# Patient Record
Sex: Female | Born: 1940 | Race: Black or African American | Hispanic: No | Smoking: Former smoker
Health system: Southern US, Community
[De-identification: ages and names within clinical notes are randomized; demographics above are authoritative.]

## PROBLEM LIST (undated history)

## (undated) DIAGNOSIS — K219 Gastro-esophageal reflux disease without esophagitis: Secondary | ICD-10-CM

## (undated) DIAGNOSIS — E119 Type 2 diabetes mellitus without complications: Secondary | ICD-10-CM

## (undated) DIAGNOSIS — F419 Anxiety disorder, unspecified: Secondary | ICD-10-CM

## (undated) DIAGNOSIS — K635 Polyp of colon: Secondary | ICD-10-CM

## (undated) DIAGNOSIS — D510 Vitamin B12 deficiency anemia due to intrinsic factor deficiency: Secondary | ICD-10-CM

## (undated) DIAGNOSIS — E785 Hyperlipidemia, unspecified: Secondary | ICD-10-CM

## (undated) DIAGNOSIS — E538 Deficiency of other specified B group vitamins: Secondary | ICD-10-CM

## (undated) DIAGNOSIS — I10 Essential (primary) hypertension: Secondary | ICD-10-CM

## (undated) DIAGNOSIS — E042 Nontoxic multinodular goiter: Secondary | ICD-10-CM

## (undated) DIAGNOSIS — M1990 Unspecified osteoarthritis, unspecified site: Secondary | ICD-10-CM

## (undated) DIAGNOSIS — J45909 Unspecified asthma, uncomplicated: Secondary | ICD-10-CM

## (undated) HISTORY — DX: Unspecified asthma, uncomplicated: J45.909

## (undated) HISTORY — DX: Vitamin B12 deficiency anemia due to intrinsic factor deficiency: D51.0

## (undated) HISTORY — DX: Deficiency of other specified B group vitamins: E53.8

## (undated) HISTORY — PX: UTERINE FIBROID SURGERY: SHX826

## (undated) HISTORY — DX: Type 2 diabetes mellitus without complications: E11.9

## (undated) HISTORY — DX: Nontoxic multinodular goiter: E04.2

## (undated) HISTORY — DX: Polyp of colon: K63.5

## (undated) HISTORY — DX: Hyperlipidemia, unspecified: E78.5

## (undated) HISTORY — DX: Anxiety disorder, unspecified: F41.9

## (undated) HISTORY — DX: Essential (primary) hypertension: I10

---

## 1998-01-05 ENCOUNTER — Other Ambulatory Visit: Admission: RE | Admit: 1998-01-05 | Discharge: 1998-01-05 | Payer: Self-pay | Admitting: Obstetrics and Gynecology

## 1999-09-20 ENCOUNTER — Other Ambulatory Visit: Admission: RE | Admit: 1999-09-20 | Discharge: 1999-09-20 | Payer: Self-pay | Admitting: Obstetrics and Gynecology

## 2001-02-12 ENCOUNTER — Other Ambulatory Visit: Admission: RE | Admit: 2001-02-12 | Discharge: 2001-02-12 | Payer: Self-pay | Admitting: Obstetrics and Gynecology

## 2002-09-09 ENCOUNTER — Other Ambulatory Visit: Admission: RE | Admit: 2002-09-09 | Discharge: 2002-09-09 | Payer: Self-pay | Admitting: Obstetrics and Gynecology

## 2003-09-15 ENCOUNTER — Other Ambulatory Visit: Admission: RE | Admit: 2003-09-15 | Discharge: 2003-09-15 | Payer: Self-pay | Admitting: Obstetrics and Gynecology

## 2004-01-26 ENCOUNTER — Ambulatory Visit (HOSPITAL_COMMUNITY): Admission: RE | Admit: 2004-01-26 | Discharge: 2004-01-27 | Payer: Self-pay | Admitting: Ophthalmology

## 2005-05-02 ENCOUNTER — Other Ambulatory Visit: Admission: RE | Admit: 2005-05-02 | Discharge: 2005-05-02 | Payer: Self-pay | Admitting: Obstetrics and Gynecology

## 2005-05-23 ENCOUNTER — Ambulatory Visit: Payer: Self-pay | Admitting: Cardiology

## 2007-01-22 DIAGNOSIS — K635 Polyp of colon: Secondary | ICD-10-CM

## 2007-01-22 HISTORY — DX: Polyp of colon: K63.5

## 2007-08-17 ENCOUNTER — Encounter: Payer: Self-pay | Admitting: Endocrinology

## 2007-08-17 ENCOUNTER — Ambulatory Visit (HOSPITAL_COMMUNITY): Admission: RE | Admit: 2007-08-17 | Discharge: 2007-08-17 | Payer: Self-pay | Admitting: Internal Medicine

## 2007-12-10 ENCOUNTER — Encounter: Payer: Self-pay | Admitting: Endocrinology

## 2007-12-16 ENCOUNTER — Encounter: Payer: Self-pay | Admitting: Endocrinology

## 2007-12-16 LAB — CONVERTED CEMR LAB
Alkaline Phosphatase: 66 units/L
Calcium: 9.2 mg/dL
Cholesterol: 179 mg/dL
Creatinine, Ser: 0.9 mg/dL
Ferritin: 49 ng/mL
Glucose, Bld: 97 mg/dL
HDL: 71 mg/dL
LDL (calc): 91 mg/dL
Lymphocytes, automated: 1.7 %
MCV: 83.8 fL
RDW: 15 %
Sodium: 143 meq/L
TIBC: 318 ug/dL
Total Bilirubin: 0.3 mg/dL
Total Protein: 6.4 g/dL

## 2008-01-11 ENCOUNTER — Emergency Department (HOSPITAL_COMMUNITY): Admission: EM | Admit: 2008-01-11 | Discharge: 2008-01-11 | Payer: Self-pay | Admitting: Emergency Medicine

## 2008-04-13 ENCOUNTER — Ambulatory Visit (HOSPITAL_COMMUNITY): Admission: RE | Admit: 2008-04-13 | Discharge: 2008-04-13 | Payer: Self-pay | Admitting: Internal Medicine

## 2008-06-21 ENCOUNTER — Encounter: Payer: Self-pay | Admitting: Endocrinology

## 2008-06-24 ENCOUNTER — Encounter: Payer: Self-pay | Admitting: Gastroenterology

## 2008-06-24 ENCOUNTER — Encounter: Payer: Self-pay | Admitting: Endocrinology

## 2008-06-24 LAB — CONVERTED CEMR LAB
Eosinophils Relative: 0.2 %
Hemoglobin: 11.7 g/dL
Hgb A1c MFr Bld: 6.1 %
Iron: 76 ug/dL
MCV: 86.3 fL
Monocytes Relative: 0.6 %
Platelets: 224 10*3/uL
RBC: 4.02 M/uL
TIBC: 361 ug/dL
WBC: 4.9 10*3/uL

## 2008-06-28 ENCOUNTER — Encounter: Payer: Self-pay | Admitting: Endocrinology

## 2008-06-28 LAB — CONVERTED CEMR LAB: Ferritin: 40 ng/mL

## 2008-07-04 ENCOUNTER — Ambulatory Visit (HOSPITAL_COMMUNITY): Payer: Self-pay | Admitting: Oncology

## 2008-07-04 ENCOUNTER — Encounter (HOSPITAL_COMMUNITY): Admission: RE | Admit: 2008-07-04 | Discharge: 2008-08-03 | Payer: Self-pay | Admitting: Oncology

## 2008-07-27 ENCOUNTER — Ambulatory Visit: Payer: Self-pay | Admitting: Internal Medicine

## 2008-07-27 DIAGNOSIS — R198 Other specified symptoms and signs involving the digestive system and abdomen: Secondary | ICD-10-CM

## 2008-07-28 ENCOUNTER — Encounter: Payer: Self-pay | Admitting: Internal Medicine

## 2008-08-03 ENCOUNTER — Telehealth: Payer: Self-pay | Admitting: Gastroenterology

## 2008-08-04 ENCOUNTER — Ambulatory Visit: Payer: Self-pay | Admitting: Internal Medicine

## 2008-11-04 ENCOUNTER — Ambulatory Visit (HOSPITAL_COMMUNITY): Payer: Self-pay | Admitting: Oncology

## 2008-11-04 ENCOUNTER — Encounter (HOSPITAL_COMMUNITY): Admission: RE | Admit: 2008-11-04 | Discharge: 2008-12-04 | Payer: Self-pay | Admitting: Oncology

## 2009-03-07 ENCOUNTER — Encounter: Payer: Self-pay | Admitting: Endocrinology

## 2009-03-07 ENCOUNTER — Encounter: Payer: Self-pay | Admitting: Gastroenterology

## 2009-03-07 ENCOUNTER — Ambulatory Visit (HOSPITAL_COMMUNITY): Admission: RE | Admit: 2009-03-07 | Discharge: 2009-03-07 | Payer: Self-pay | Admitting: Internal Medicine

## 2009-04-27 ENCOUNTER — Encounter: Payer: Self-pay | Admitting: Internal Medicine

## 2009-05-02 ENCOUNTER — Telehealth (INDEPENDENT_AMBULATORY_CARE_PROVIDER_SITE_OTHER): Payer: Self-pay | Admitting: *Deleted

## 2009-05-05 ENCOUNTER — Encounter (INDEPENDENT_AMBULATORY_CARE_PROVIDER_SITE_OTHER): Payer: Self-pay | Admitting: *Deleted

## 2009-06-06 ENCOUNTER — Ambulatory Visit: Payer: Self-pay | Admitting: Gastroenterology

## 2009-06-06 DIAGNOSIS — R159 Full incontinence of feces: Secondary | ICD-10-CM

## 2009-06-27 ENCOUNTER — Encounter: Payer: Self-pay | Admitting: Endocrinology

## 2009-07-04 ENCOUNTER — Encounter (HOSPITAL_COMMUNITY): Admission: RE | Admit: 2009-07-04 | Discharge: 2009-08-03 | Payer: Self-pay | Admitting: Oncology

## 2009-07-04 ENCOUNTER — Ambulatory Visit (HOSPITAL_COMMUNITY): Payer: Self-pay | Admitting: Oncology

## 2009-11-02 ENCOUNTER — Encounter: Payer: Self-pay | Admitting: Endocrinology

## 2009-11-09 ENCOUNTER — Ambulatory Visit: Payer: Self-pay | Admitting: Endocrinology

## 2009-11-09 DIAGNOSIS — J45909 Unspecified asthma, uncomplicated: Secondary | ICD-10-CM

## 2009-11-09 DIAGNOSIS — E119 Type 2 diabetes mellitus without complications: Secondary | ICD-10-CM

## 2009-11-09 DIAGNOSIS — E538 Deficiency of other specified B group vitamins: Secondary | ICD-10-CM

## 2009-11-09 HISTORY — DX: Type 2 diabetes mellitus without complications: E11.9

## 2009-11-09 HISTORY — DX: Deficiency of other specified B group vitamins: E53.8

## 2009-11-09 HISTORY — DX: Unspecified asthma, uncomplicated: J45.909

## 2009-11-09 LAB — CONVERTED CEMR LAB
Fructosamine: 270 micromoles/L (ref <285)
Hgb A1c MFr Bld: 6.5 % (ref 4.6–6.5)

## 2009-11-15 ENCOUNTER — Encounter: Admission: RE | Admit: 2009-11-15 | Discharge: 2009-11-15 | Payer: Self-pay | Admitting: Endocrinology

## 2009-11-20 ENCOUNTER — Telehealth (INDEPENDENT_AMBULATORY_CARE_PROVIDER_SITE_OTHER): Payer: Self-pay | Admitting: *Deleted

## 2009-11-27 ENCOUNTER — Telehealth: Payer: Self-pay | Admitting: Endocrinology

## 2009-12-06 ENCOUNTER — Encounter: Payer: Self-pay | Admitting: Endocrinology

## 2009-12-07 ENCOUNTER — Encounter: Payer: Self-pay | Admitting: Endocrinology

## 2009-12-07 DIAGNOSIS — E042 Nontoxic multinodular goiter: Secondary | ICD-10-CM

## 2009-12-07 HISTORY — DX: Nontoxic multinodular goiter: E04.2

## 2009-12-13 ENCOUNTER — Encounter: Admission: RE | Admit: 2009-12-13 | Discharge: 2009-12-13 | Payer: Self-pay | Admitting: Endocrinology

## 2009-12-13 ENCOUNTER — Encounter: Payer: Self-pay | Admitting: Endocrinology

## 2009-12-13 ENCOUNTER — Other Ambulatory Visit
Admission: RE | Admit: 2009-12-13 | Discharge: 2009-12-13 | Payer: Self-pay | Source: Home / Self Care | Admitting: Interventional Radiology

## 2010-02-09 ENCOUNTER — Ambulatory Visit
Admission: RE | Admit: 2010-02-09 | Discharge: 2010-02-09 | Payer: Self-pay | Source: Home / Self Care | Attending: Endocrinology | Admitting: Endocrinology

## 2010-02-09 ENCOUNTER — Other Ambulatory Visit: Payer: Self-pay | Admitting: Endocrinology

## 2010-02-09 LAB — HEMOGLOBIN A1C: Hgb A1c MFr Bld: 6.3 % (ref 4.6–6.5)

## 2010-02-10 ENCOUNTER — Encounter: Payer: Self-pay | Admitting: Endocrinology

## 2010-02-18 LAB — CONVERTED CEMR LAB
CO2: 24 meq/L
Calcium: 9.4 mg/dL
Creatinine, Ser: 0.92 mg/dL
Eosinophils Relative: 0.3 %
HCT: 35.2 %
Hemoglobin: 11.1 g/dL
Hgb A1c MFr Bld: 6.2 %
Lymphocytes, automated: 1.8 %
MCV: 85.6 fL
Platelets: 232 10*3/uL
RBC: 4.11 M/uL
RDW: 16.4 %
Sodium: 141 meq/L

## 2010-02-22 NOTE — Letter (Signed)
Summary: [facility]   Imported By: Sherian Rein 11/24/2009 10:58:52  _____________________________________________________________________  External Attachment:    Type:   Image     Comment:   External Document

## 2010-02-22 NOTE — Assessment & Plan Note (Signed)
Summary: PER PT 3 MTH FU  D/T  STC   Vital Signs:  Patient profile:   70 year old female Height:      65 inches (165.10 cm) Weight:      193.38 pounds (87.90 kg) BMI:     32.30 O2 Sat:      97 % on Room air Temp:     98.3 degrees F (36.83 degrees C) oral Pulse rate:   70 / minute Pulse rhythm:   regular BP sitting:   112 / 70  (left arm) Cuff size:   regular  Vitals Entered By: Brenton Grills CMA Duncan Dull) (February 09, 2010 8:11 AM)  O2 Flow:  Room air CC: 3 month F/U/aj Is Patient Diabetic? Yes   Referring Provider:  Elfredia Nevins MD Primary Provider:  Kathrin Ruddy  CC:  3 month F/U/aj.  History of Present Illness: pt states she feels well in general.  i have reread the report from opthal in june, 2011.  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    Current Medications (verified): 1)  Hydroxyzine Hcl 25 Mg Tabs (Hydroxyzine Hcl) .... Take 1 Tablet By Mouth Once A Day 2)  Simvastatin 40 Mg Tabs (Simvastatin) .... Take 1 Tablet By Mouth Once A Day 3)  Aleve 220 Mg Tabs (Naproxen Sodium) .... As Needed 4)  Melatonin 5 Mg Caps (Melatonin) .... Take 1 Tab By Mouth At Bedtime 5)  Losartan Potassium 25 Mg Tabs (Losartan Potassium) .... Take 1 Tablet By Mouth Once A Day 6)  Anusol-Hc 25 Mg Supp (Hydrocortisone Acetate) .... Take One Suppository Q.h.s. 7)  Tsh .... 241.1  Allergies (verified): 1)  ! Penicillin 2)  ! Sulfa  Past History:  Past Medical History: Last updated: 06/06/2009 Anxiety Disorder Diabetes Hyperlipidemia Hypertension Pernicious anemia, on monthly B12 injections by PCP Chronic recurrent sinusitis Colon Polyps-2009  Review of Systems  The patient denies hypoglycemia.    Physical Exam  General:  normal appearance.   Pulses:  dorsalis pedis intact bilat.    Extremities:  no deformity.  no ulcer on the feet.  feet are of normal color and temp.  no edema  Neurologic:  sensation is intact to touch on the  feet  Additional Exam:  Hemoglobin A1C            6.3 %     Impression & Recommendations:  Problem # 1:  DM (ICD-250.00) well-controlled as i reread the opthal report, she has htn retinopathy, but not dm retinopathy  Other Orders: TLB-A1C / Hgb A1C (Glycohemoglobin) (83036-A1C) Est. Patient Level III ([address])  Patient Instructions: 1)  blood tests are being ordered for you today.  please call [phone] to hear your test results. 2)  pending the test results, please continue the same medications for now 3)  check your blood sugar 1 time a day.  vary the time of day when you check, between before the 3 meals, and at bedtime.  also check if you have symptoms of your blood sugar being too high or too low.  please keep a record of the readings and bring it to your next appointment here.  please call us sooner if you are having low blood sugar episodes. 4)  Please schedule a follow-up appointment in 6 months. 5)  (update: i left message on phone-tree:  rx as we discussed).   Orders Added: 1)  TLB-A1C / Hgb A1C (Glycohemoglobin) [83036-A1C] 2)  Est. Patient Level III [81191]

## 2010-02-22 NOTE — Letter (Signed)
Summary: New Patient letter  [facility]  [address] [place], [place] [address]   Phone: [phone]  Fax: [phone]       05/05/2009 MRN: [id number]  [facility] [address] [place], [place]  [address]  Dear Ms. Livingston,  Welcome to the Gastroenterology Division at [facility].    You are scheduled to see Dr.  Arlyce Dice on 06-06-09 at 1:30p.m. on the 3rd floor at [facility], [address]  We ask that you try to arrive at our office 15 minutes prior to your appointment time to allow for check-in.  We would like you to complete the enclosed self-administered evaluation form prior to your visit and bring it with you on the day of your appointment.  We will review it with you.  Also, please bring a complete list of all your medications or, if you prefer, bring the medication bottles and we will list them.  Please bring your insurance card so that we may make a copy of it.  If your insurance requires a referral to see a specialist, please bring your referral form from your primary care physician.  Co-payments are due at the time of your visit and may be paid by cash, check or credit card.     Your office visit will consist of a consult with your physician (includes a physical exam), any laboratory testing he/she may order, scheduling of any necessary diagnostic testing (e.g. x-ray, ultrasound, CT-scan), and scheduling of a procedure (e.g. Endoscopy, Colonoscopy) if required.  Please allow enough time on your schedule to allow for any/all of these possibilities.    If you cannot keep your appointment, please call [phone] to cancel or reschedule prior to your appointment date.  This allows [place] the opportunity to schedule an appointment for another patient in need of care.  If you do not cancel or reschedule by 5 p.m. the business day prior to your appointment date, you will be charged a $50.00 late cancellation/no-show fee.    Thank you for choosing  [facility] for your medical needs.  We appreciate the opportunity to care for you.  Please visit [place] at our website  to learn more about our practice.                     Sincerely,                                                             The Gastroenterology Division

## 2010-02-22 NOTE — Progress Notes (Signed)
  Phone Note Other Incoming   Request: Send information Summary of Call: Records received from Dr. Ashley Royalty. 41 pages forwarded to Dr. Everardo All for review.

## 2010-02-22 NOTE — Progress Notes (Signed)
Summary: records for Dr. Arlyce Dice to review  ---- Converted from flag ---- ---- 05/01/2009 4:40 PM, Tawni Levy wrote: Patient wants to know if Dr Arlyce Dice reviewed her records and if so can she get an appt with him ------------------------------  Phone Note Outgoing Call Call back at [phone] home number   Call placed by: Harlow Mares CMA Duncan Dull),  May 02, 2009 11:49 AM Call placed to: Patient Summary of Call: spoke to patient and all of her records from Dr. Jena Gauss are in EMR she never saw him she only seen the PA. Paitent wants to change to Dr. Arlyce Dice because she did not feel like the PA helped her and she never seen the MD. Dr. Arlyce Dice will you accept this patient. Initial call taken by: Harlow Mares CMA Duncan Dull),  May 02, 2009 11:51 AM  Follow-up for Phone Call        ok Follow-up by: Louis Meckel MD,  May 03, 2009 9:08 AM  Additional Follow-up for Phone Call Additional follow up Details #1::        i willl route this to [facility] to schedule the patient. Additional Follow-up by: Harlow Mares CMA Duncan Dull),  May 04, 2009 8:37 AM    Additional Follow-up for Phone Call Additional follow up Details #2::    Pt. is sch'd for 06-06-09 w/Dr. Arlyce Dice. Pt. aware of meds, copay and cx policy Follow-up by: Karna Christmas,  May 05, 2009 9:55 AM

## 2010-02-22 NOTE — Letter (Signed)
Summary: [name]  [name]   Imported By: Lester [place] 06/12/2009 10:29:43  _____________________________________________________________________  External Attachment:    Type:   Image     Comment:   External Document

## 2010-02-22 NOTE — Miscellaneous (Signed)
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

## 2010-02-22 NOTE — Letter (Signed)
Summary: Results Letter  [facility]  [address] [place], [place] [address]   Phone: [phone]  Fax: [phone]        Jun 06, 2009 MRN: [id number]    [facility] [address] [place], [place]  [address]    Dear Ms. Mcghee,  It is my pleasure to have treated you recently as a new patient in my office. I appreciate your confidence and the opportunity to participate in your care.  Since I do have a busy inpatient endoscopy schedule and office schedule, my office hours vary weekly. I am, however, available for emergency calls everyday through my office. If I am not available for an urgent office appointment, another one of our gastroenterologist will be able to assist you.  My well-trained staff are prepared to help you at all times. For emergencies after office hours, a physician from our Gastroenterology section is always available through my 24 hour answering service  Once again I welcome you as a new patient and I look forward to a happy and healthy relationship             Sincerely,  Louis Meckel MD  This letter has been electronically signed by your physician.  Appended Document: Results Letter letter mailed

## 2010-02-22 NOTE — Assessment & Plan Note (Signed)
Summary: New Endo diabetes/Bcbs/#/cd   Vital Signs:  Patient profile:   70 year old female Height:      65 inches (165.10 cm) Weight:      196.44 pounds (89.29 kg) BMI:     32.81 O2 Sat:      96 % on Room air Temp:     97.9 degrees F (36.61 degrees C) oral Pulse rate:   69 / minute Pulse rhythm:   regular BP sitting:   112 / 64  (left arm) Cuff size:   regular  Vitals Entered By: Brenton Grills MA (November 09, 2009 8:20 AM)  O2 Flow:  Room air CC: New Endo Consult/DM II and thyroid/aj Is Patient Diabetic? Yes   Referring Provider:  Elfredia Nevins MD Primary Provider:  Kathrin Ruddy  CC:  New Endo Consult/DM II and thyroid/aj.  History of Present Illness: pt was dx'ed with dm in approx 2000.  she took metformin from then until 2010.  she stopped it due to 6 weeks of moderate diarrhea, but no assoc brbpr.  she has been off dm meds since then.  pt says chiropractor told her of a thyroid problem incidentally noted on x ray.  she is now on a "body cleanse" medication given by chiropractor, for diabetes and thyroid problem.   no cbg record, but states cbg's are low 100's. she says her diet and exercise are "good." she has had laser surgery for "bleeding behind the eyes."  Current Medications (verified): 1)  Hydroxyzine Hcl 25 Mg Tabs (Hydroxyzine Hcl) .... Take 1 Tablet By Mouth Once A Day 2)  Simvastatin 40 Mg Tabs (Simvastatin) .... Take 1 Tablet By Mouth Once A Day 3)  Aleve 220 Mg Tabs (Naproxen Sodium) .... As Needed 4)  Melatonin 5 Mg Caps (Melatonin) .... Take 1 Tab By Mouth At Bedtime 5)  Losartan Potassium 25 Mg Tabs (Losartan Potassium) .... Take 1 Tablet By Mouth Once A Day 6)  Anusol-Hc 25 Mg Supp (Hydrocortisone Acetate) .... Take One Suppository Q.h.s.  Allergies (verified): 1)  ! Penicillin 2)  ! Sulfa  Past History:  Past Medical History: Last updated: 06/06/2009 Anxiety Disorder Diabetes Hyperlipidemia Hypertension Pernicious anemia, on monthly B12  injections by PCP Chronic recurrent sinusitis Colon Polyps-2009  Family History: Reviewed history from 06/06/2009 and no changes required. 1/2 Brother deceased - colon cancer, dx age 79 Cousin-deceased- colon cancer, dx age 67s Nephew-colon cancer 1/2 Sister - Breast cancer Family History of Diabetes: Father, PGM, MGM, Mother Family History of Heart Disease: Mother, Father mother had resection of a benign goiter  Social History: Reviewed history from 06/06/2009 and no changes required. 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 Alcohol Use-1 drink/day Daily Caffeine Use 1-2 drinks/day Illicit Drug Use - no  Review of Systems       denies weight loss, headache, chest pain, sob, n/v, excessive diaphoresis, menopausal sxs, memory loss, hypoglycemia, rhinorrhea, and easy bruising.   she has chronic blurry vision, muscle cramps, anxiety, and polyuria.  Physical Exam  General:  normal appearance.   Head:  head: no deformity eyes: no periorbital swelling, no proptosis external nose and ears are normal mouth: no lesion seen Neck:  there is a 2-cm right thyroid nodule Lungs:  Clear to auscultation bilaterally. Normal respiratory effort.  Heart:  Regular rate and rhythm without murmurs or gallops noted. Normal S1,S2.   Abdomen:  abdomen is soft, nontender.  no hepatosplenomegaly.   not distended.  no hernia  Msk:  muscle bulk and strength are grossly normal.  no obvious joint swelling.  gait is normal and steady  Pulses:  dorsalis pedis intact bilat.  no carotid bruit  Extremities:  no deformity.  no ulcer on the feet.  feet are of normal color and temp.  no edema  Neurologic:  cn 2-12 grossly intact.   readily moves all 4's.   sensation is intact to touch on the feet  Skin:  normal texture and temp.  no rash.  not diaphoretic  Cervical Nodes:  No significant  adenopathy.  Psych:  Alert and cooperative; normal mood and affect; normal attention span and concentration.   Additional Exam:  Fructosamine              270 umol/L   (converts to a1c of 6.3)   Impression & Recommendations:  Problem # 1:  DM (ICD-250.00) in view of #2, she should aggressively rx a1c  Problem # 2:  retinopathy this seems out of proportion from her relatively benign a1c's  Problem # 3:  diarrhea this limits rx with metformin  Other Orders: T-Fructosamine ([address]98-11914) Radiology Referral (Radiology) TLB-A1C / Hgb A1C (Glycohemoglobin) (83036-A1C) Consultation Level IV ([address]295)  Patient Instructions: 1)  please sign release of information for the report from dr Ashley Royalty. 2)  good diet and exercise habits significanly improve the control of your diabetes.  please let me know if you wish to be referred to a dietician.  high blood sugar is very risky to your health.  you should see an eye doctor every year. 3)  controlling your blood pressure and cholesterol drastically reduces the damage diabetes does to your body.  this also applies to quitting smoking.  please discuss these with your doctor.  you should take an aspirin every day, unless you have been advised by a doctor not to. 4)  check your blood sugar 1 time a day.  vary the time of day when you check, between before the 3 meals, and at bedtime.  also check if you have symptoms of your blood sugar being too high or too low.  please keep a record of the readings and bring it to your next appointment here.  please call us sooner if you are having low blood sugar episodes. 5)  check thyroid ultrasound.  you will be called with a day and time for an appointment. 6)  blood tests and ultrasound are being ordered for you today.  please call [phone] to hear each of your test results. 7)  Please schedule a follow-up appointment in 3 months. 8)  (update: i left message on phone-tree:  please consider [place]).   Orders  Added: 1)  T-Fructosamine [[id number]] 2)  Radiology Referral [Radiology] 3)  TLB-A1C / Hgb A1C (Glycohemoglobin) [83036-A1C] 4)  Consultation Level IV [28413]

## 2010-02-22 NOTE — Letter (Signed)
Summary: [name]-[facility]  [name]   Imported By: Lester [place] 06/12/2009 10:28:34  _____________________________________________________________________  External Attachment:    Type:   Image     Comment:   External Document

## 2010-02-22 NOTE — Assessment & Plan Note (Signed)
Summary: problems w/colon--ch.   History of Present Illness Visit Type: new patient Primary GI MD: Melvia Heaps MD [facility] Primary Provider: Kathrin Ruddy Requesting Provider: n/a Chief Complaint: lower abdominal pain since March 2011 History of Present Illness:   Ms. Grace Cordova is a pleasant 70 yo AA female referred at the request of Dr. Alroy Bailiff for evaluation of diarrhea.  Iin December 2009 she was started on antibiotics for recurrent URIs.  She took antibiotics for approximately 4 months.  Thereafter she suffered from chronic diarrhea that lasted for at least 2 more months.  Stool studies apparently were negative.  Once she started Align diarrhea subsided.  She took this for several months.  After discontinuing this she had recurrent diarrhea which has subsided.  She was having sharp lower abdominal pains approximately 2 months ago but  these have subsided as well.  There is no history of melena or hematochezia.  She does complain of some stool incontinence consistenting  of stool that recurs when she wipes herself after urinating.  She does not soil her underclothes but  constantly has stool in the rectal area when she wipes.  Family history is pertinent for brother with colon cancer.  Adenomatous polyps were removed in 2009.   GI Review of Systems    Reports abdominal pain, belching, and  bloating.     Location of  Abdominal pain: lower abdomen.    Denies acid reflux, chest pain, dysphagia with liquids, dysphagia with solids, heartburn, loss of appetite, nausea, vomiting, vomiting blood, weight loss, and  weight gain.      Reports change in bowel habits, constipation, diarrhea, and  fecal incontinence.     Denies anal fissure, black tarry stools, diverticulosis, heme positive stool, hemorrhoids, irritable bowel syndrome, jaundice, light color stool, liver problems, rectal bleeding, and  rectal pain. Preventive Screening-Counseling & Management      Drug Use:  no.      Current Medications  (verified): 1)  Hydroxyzine Hcl 25 Mg Tabs (Hydroxyzine Hcl) .... Take 1 Tablet By Mouth Once A Day 2)  Simvastatin 40 Mg Tabs (Simvastatin) .... Take 1 Tablet By Mouth Once A Day 3)  Aleve 220 Mg Tabs (Naproxen Sodium) .... As Needed 4)  Melatonin 5 Mg Caps (Melatonin) .... Take 1 Tab By Mouth At Bedtime 5)  Losartan Potassium 25 Mg Tabs (Losartan Potassium) .... Take 1 Tablet By Mouth Once A Day  Allergies (verified): 1)  ! Penicillin 2)  ! Sulfa  Past History:  Past Medical History: Anxiety Disorder Diabetes Hyperlipidemia Hypertension Pernicious anemia, on monthly B12 injections by PCP Chronic recurrent sinusitis Colon Polyps-2009  Past Surgical History: Reviewed history from 07/27/2008 and no changes required. 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 Alcohol Use-1 drink/day Daily Caffeine Use 1-2 drinks/day Illicit Drug Use - no Drug Use:  no  Review of Systems       The patient complains of cough, fatigue, itching, muscle pains/cramps, shortness of breath, skin rash, sleeping problems, and vision changes.  The patient denies allergy/sinus, anemia, anxiety-new, arthritis/joint pain, back pain, blood in urine, breast changes/lumps, confusion, coughing up blood, depression-new, fainting, fever, headaches-new,  hearing problems, heart murmur, heart rhythm changes, menstrual pain, night sweats, nosebleeds, pregnancy symptoms, sore throat, swelling of feet/legs, swollen lymph glands, thirst - excessive, urination - excessive, urination changes/pain, urine leakage, and  voice change.         All other systems were reviewed and were negative   Vital Signs:  Patient profile:   70 year old female Height:      65 inches Weight:      199 pounds BMI:     33.24 Pulse rate:   68 / minute Pulse rhythm:   regular BP sitting:   108 / 60  (left arm) Cuff size:   regular  Vitals Entered By: Francee Piccolo CMA Duncan Dull) (Jun 06, 2009 1:34 PM)  Physical Exam  Additional Exam:  She is a well-developed well-nourished female  skin: anicteric HEENT: normocephalic; PEERLA; no nasal or pharyngeal abnormalities neck: supple nodes: no cervical lymphadenopathy chest: clear to ausculatation and percussion heart: no murmurs, gallops, or rubs abd: soft, nontender; BS normoactive; no abdominal masses, tenderness, organomegaly rectal: there are no rectal masses.  Stool is Hemoccult negative ext: no cynanosis, clubbing, edema skeletal: no deformities neuro: oriented x 3; no focal abnormalities    Impression & Recommendations:  Problem # 1:  CHANGE IN BOWELS (ICD-787.99) Her erratic bowels are very likely related to prior antibiotic use.  At this point symptoms have resolved while she takes align daily.  Recommendations #1 continue probiotic supplementation as needed  Problem # 2:  FULL INCONTINENCE OF FECES (ICD-787.60) Although the patient is not fully incontinent of stool she has frequent soilage.  This could be due to hemorrhoidal disease.  Recommendations #1 trial of Anusol HC suppositories #2 fiber supplementation  Problem # 3:  ADENOCARCINOMA, COLON, FAMILY HX (ICD-V16.0) Patient has colon polyps and positive family history for colon cancer.  The patient is #1 followup colonoscopy in 2014  Patient Instructions: 1)  Copy sent to : Kathrin Ruddy 2)  We are giving you fiber samples toay 3)  Please call back in a couple of weeks to let us know how you are doing 4)  The medication list was reviewed and reconciled.  All changed / newly prescribed  medications were explained.  A complete medication list was provided to the patient / caregiver. Prescriptions: ANUSOL-HC 25 MG SUPP (HYDROCORTISONE ACETATE) take one suppository q.h.s.  #7 x 1   Entered and Authorized by:   Louis Meckel MD   Signed by:   Louis Meckel MD on 06/06/2009   Method used:   Electronically to        [facility] [phone]* (retail)       [address]       [place], [place]  [address]       Ph: [phone] or [phone]       Fax: [phone]   RxID:   [id number]

## 2010-02-22 NOTE — Progress Notes (Signed)
Summary: labs  Phone Note Call from Patient Call back at Home Phone [phone]   Caller: Mom Call For: Dr Everardo All Summary of Call: Pt requests lab be done at [facility]. Dr gave her choice of here or [place]. She wants to go to [facility] for TSH. Please let her know when order is sent to [facility]. Initial call taken by: Verdell Face,  November 27, 2009 9:21 AM  Follow-up for Phone Call        i printed rx Follow-up by: Minus Breeding MD,  November 27, 2009 11:04 AM  Additional Follow-up for Phone Call Additional follow up Details #1::        Rx for lab order faxed to [facility] [phone]) lab per [facility] lab instructions. Pt informed, faxed to [phone] Additional Follow-up by: Margaret Pyle, CMA,  November 27, 2009 11:23 AM    New/Updated Medications: * TSH 241.1 Prescriptions: TSH 241.1  #0 x 0   Entered and Authorized by:   Minus Breeding MD   Signed by:   Minus Breeding MD on 11/27/2009   Method used:   Print then Give to Patient   RxID:   [id number]

## 2010-02-22 NOTE — Letter (Signed)
Summary: RELEASE OF RECORDS  RELEASE OF RECORDS   Imported By: Diana Eves 04/27/2009 09:06:28  _____________________________________________________________________  External Attachment:    Type:   Image     Comment:   External Document

## 2010-02-26 IMAGING — CT CT MAXILLOFACIAL W/O CM
3 of 4 series · 16 of 47 positions shown, 19 images · non-contrast
Comparison: None

CLINICAL DATA: Recurrent/refractory sinusitis.  Headaches.

CT MAXILLOFACIAL WITHOUT CONTRAST
TECHNIQUE: Multidetector CT imaging of the maxillofacial
structures was performed. Multiplanar CT image reconstructions were
also generated.

[Series 5: sinus 3.0 h32s · axial · 0.33mm/px · z∈[+19,+157]mm · 10 of 54 slices shown, 13 images]
[im 4/54  brain]
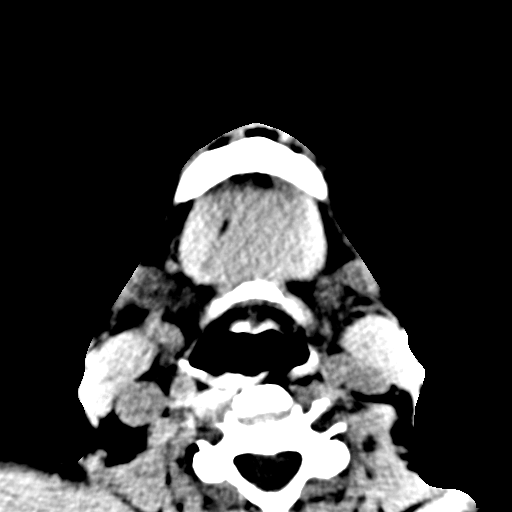
[im 4/54  bone]
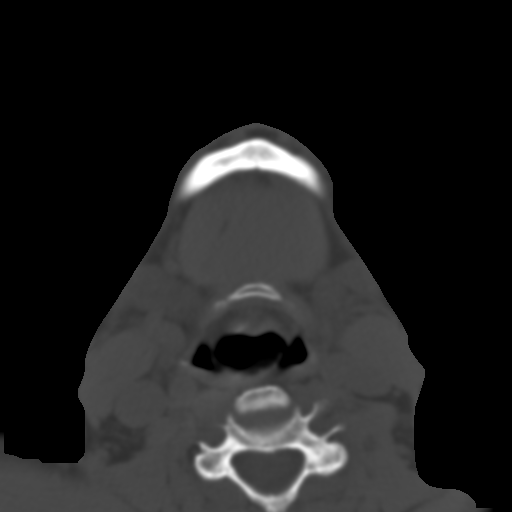
[im 10/54  bone]
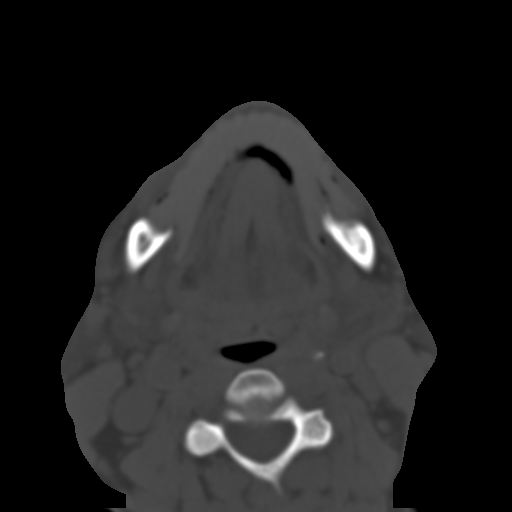
[im 16/54  bone]
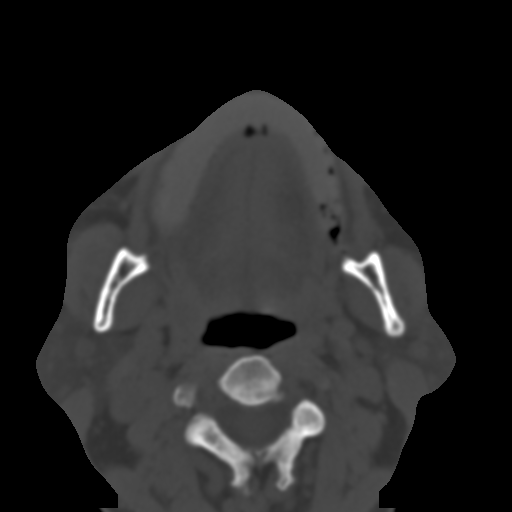
[im 19/54  bone]
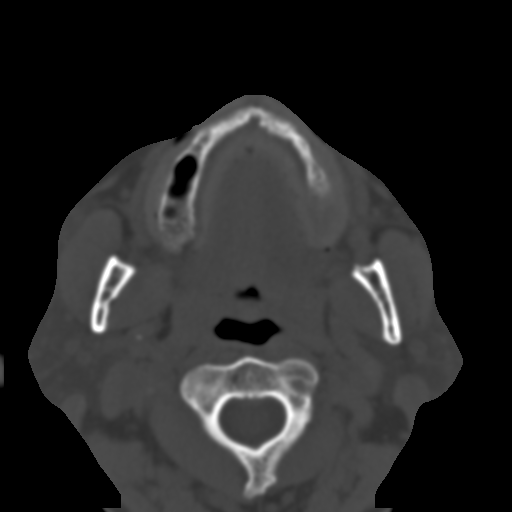
[im 25/54  brain]
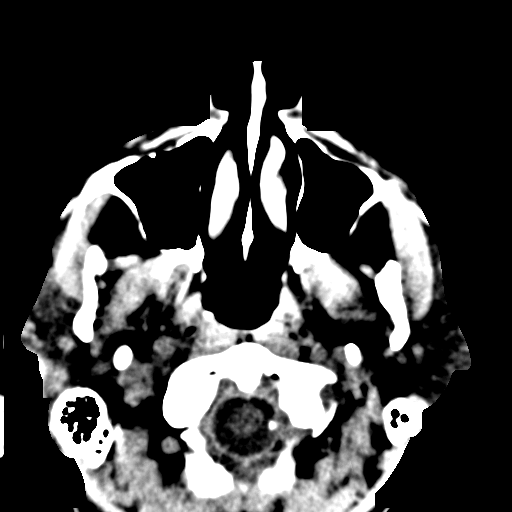
[im 25/54  bone]
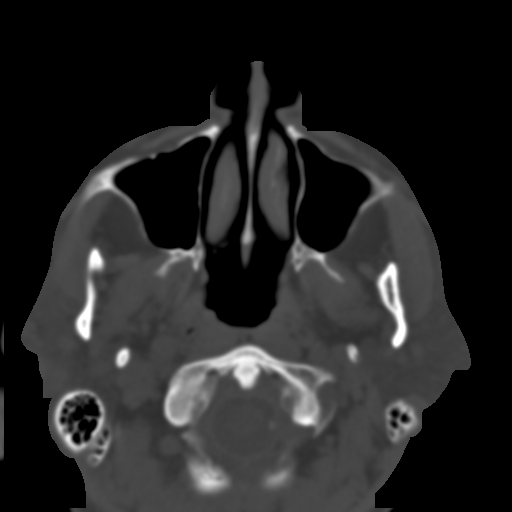
[im 29/54  bone]
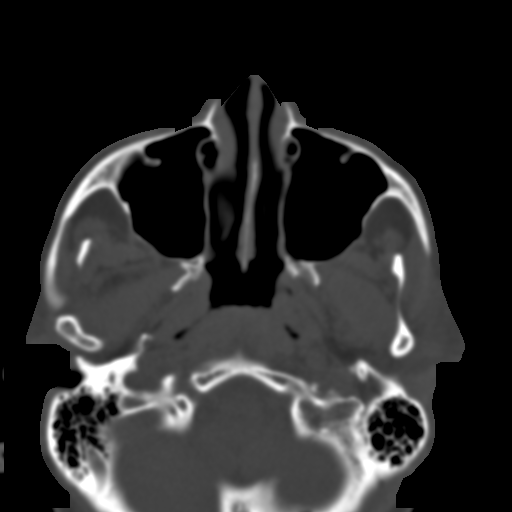
[im 35/54  bone]
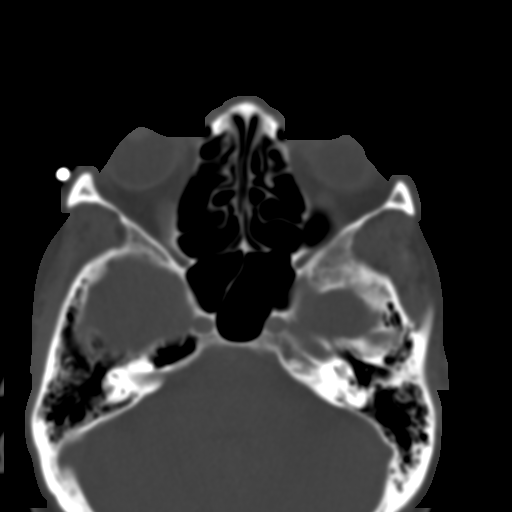
[im 41/54  bone]
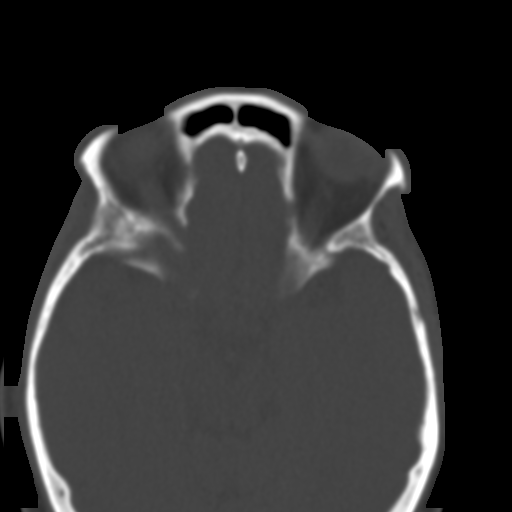
[im 44/54  brain]
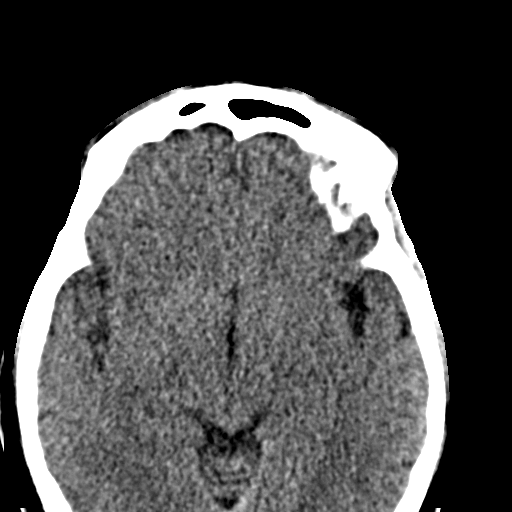
[im 44/54  bone]
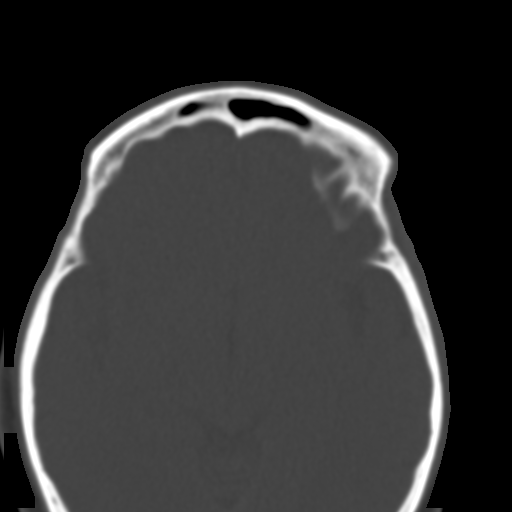
[im 50/54  bone]
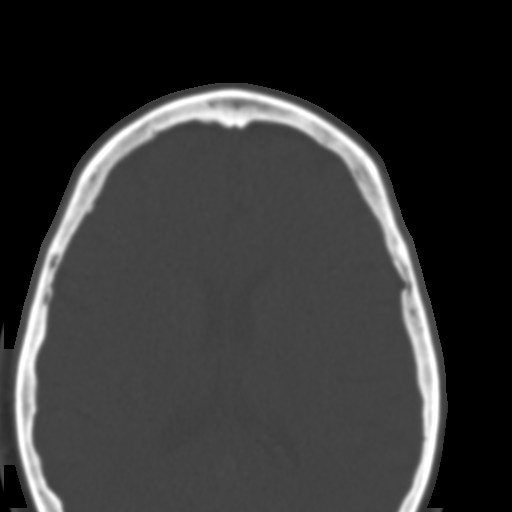

[Series 6: sinus 3.0 spo cor · coronal · 0.29mm/px · 3 of 45 slices shown]
[im 15/45  bone]
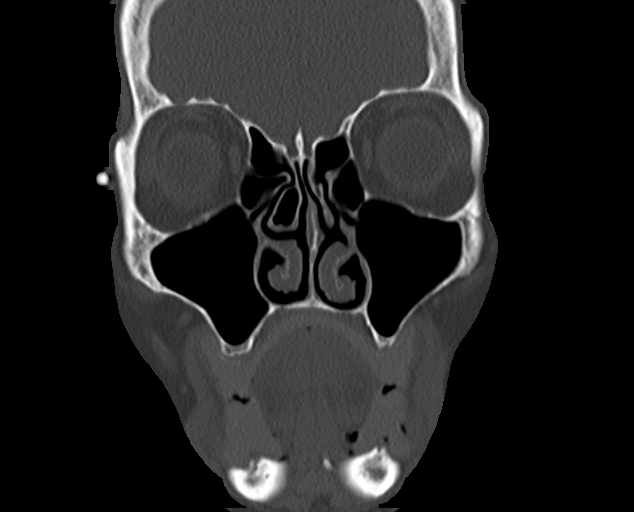
[im 20/45  bone]
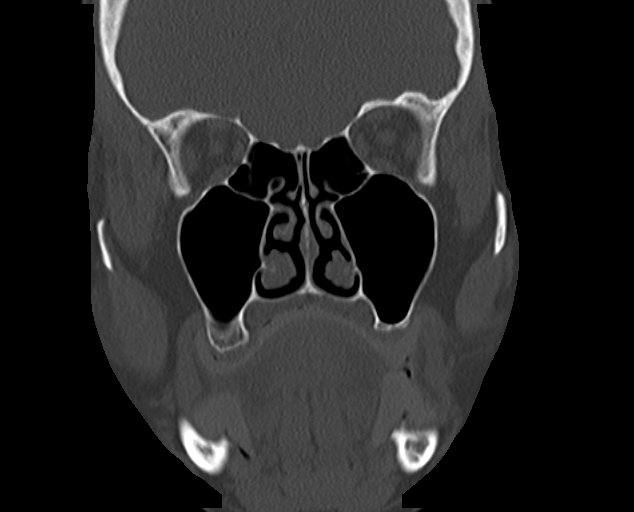
[im 25/45  bone]
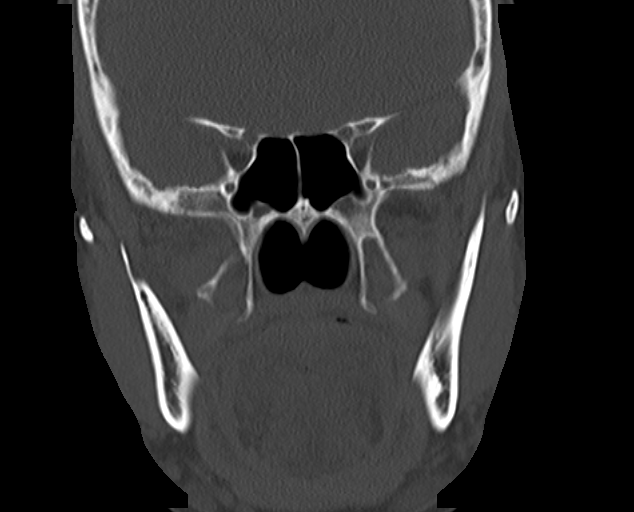

[Series 7: sinus 3.0 spo sag · sagittal · 0.32mm/px · 3 of 48 slices shown]
[im 16/48  bone]
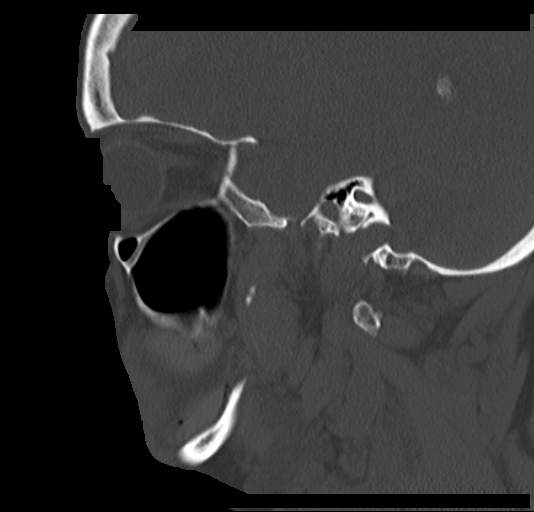
[im 24/48  bone]
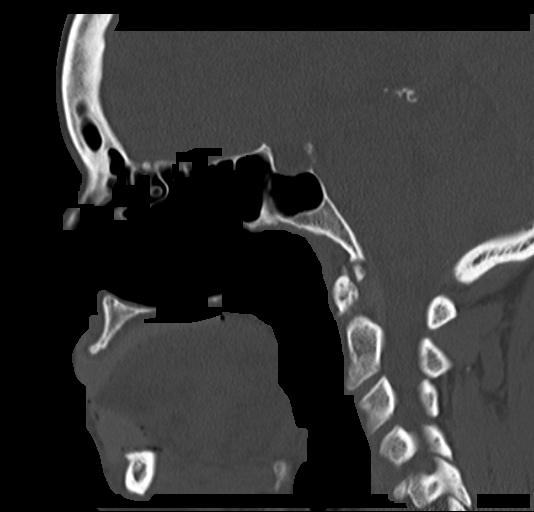
[im 32/48  bone]
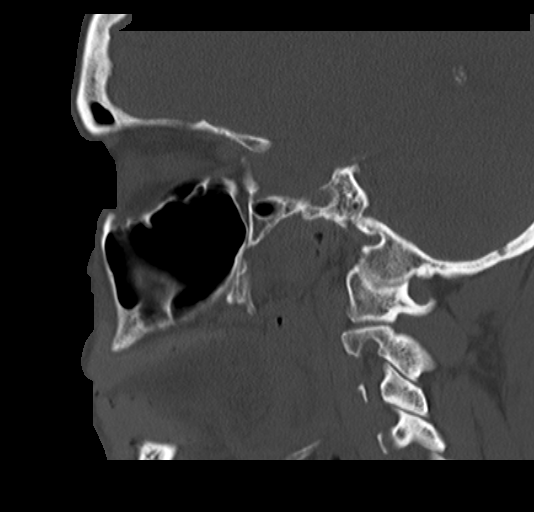

[16 of 47 positions shown; findings below may reference images not displayed]

FINDINGS: Paranasal sinuses are clear.  Infundibula appear patent.
No significant nasal septum deviation.  Mastoid air cells are
clear.  Visualized portions of the intracranial contents show no
acute findings.  Visualized soft tissues of the neck and visualized
portions of the cervical spine show no acute findings.
IMPRESSION: Clear paranasal sinuses.

## 2010-04-09 LAB — BASIC METABOLIC PANEL
BUN: 13 mg/dL (ref 6–23)
Creatinine, Ser: 0.92 mg/dL (ref 0.4–1.2)
GFR calc Af Amer: >60 mL/min (ref >60)

## 2010-04-09 LAB — CBC
HCT: 34.4 % — ABNORMAL LOW (ref 36.0–46.0)
Hemoglobin: 11.6 g/dL — ABNORMAL LOW (ref 12.0–15.0)
MCHC: 33.7 g/dL (ref 30.0–36.0)
RBC: 4.04 MIL/uL (ref 3.87–5.11)
WBC: 5.5 10*3/uL (ref 4.0–10.5)

## 2010-04-26 LAB — DIFFERENTIAL
Basophils Relative: 0 % (ref 0–1)
Lymphs Abs: 1.7 10*3/uL (ref 0.7–4.0)
Monocytes Absolute: 0.4 10*3/uL (ref 0.1–1.0)
Neutro Abs: 2.2 10*3/uL (ref 1.7–7.7)
Neutrophils Relative %: 47 % (ref 43–77)

## 2010-04-26 LAB — CBC
HCT: 32.4 % — ABNORMAL LOW (ref 36.0–46.0)
Hemoglobin: 10.9 g/dL — ABNORMAL LOW (ref 12.0–15.0)
MCHC: 33.7 g/dL (ref 30.0–36.0)
Platelets: 253 10*3/uL (ref 150–400)
RDW: 15.8 % — ABNORMAL HIGH (ref 11.5–15.5)

## 2010-04-26 LAB — RETICULOCYTES: Retic Ct Pct: 1.1 % (ref 0.4–3.1)

## 2010-04-26 LAB — HEMOGLOBIN A1C: Mean Plasma Glucose: 143 mg/dL

## 2010-04-30 LAB — RETICULOCYTES
RBC.: 4.19 MIL/uL (ref 3.87–5.11)
Retic Count, Absolute: 25.1 10*3/uL (ref 19.0–186.0)

## 2010-04-30 LAB — DIFFERENTIAL
Lymphocytes Relative: 44 % (ref 12–46)
Lymphs Abs: 2.3 10*3/uL (ref 0.7–4.0)
Monocytes Absolute: 0.6 10*3/uL (ref 0.1–1.0)
Monocytes Relative: 12 % (ref 3–12)
Neutro Abs: 2 10*3/uL (ref 1.7–7.7)
Neutrophils Relative %: 39 % — ABNORMAL LOW (ref 43–77)

## 2010-04-30 LAB — COMPREHENSIVE METABOLIC PANEL
Alkaline Phosphatase: 63 U/L (ref 39–117)
CO2: 24 mEq/L (ref 19–32)
Calcium: 10 mg/dL (ref 8.4–10.5)
Creatinine, Ser: 0.88 mg/dL (ref 0.4–1.2)
GFR calc Af Amer: >60 mL/min (ref >60)
Glucose, Bld: 102 mg/dL — ABNORMAL HIGH (ref 70–99)
Potassium: 4.3 mEq/L (ref 3.5–5.1)
Sodium: 138 mEq/L (ref 135–145)

## 2010-04-30 LAB — CBC: WBC: 5.3 10*3/uL (ref 4.0–10.5)

## 2010-04-30 LAB — ANA: Anti Nuclear Antibody(ANA): NEGATIVE

## 2010-06-08 NOTE — Op Note (Signed)
NAMEELEN, Grace Cordova                 ACCOUNT NO.:  [id number]   MEDICAL RECORD NO.:  [id number]          PATIENT TYPE:  OIB   LOCATION:  2899                         FACILITY:  [facility]   PHYSICIAN:  Beulah Gandy. Ashley Royalty, M.D. DATE OF BIRTH:  [birth year]   DATE OF PROCEDURE:  01/26/2004  DATE OF DISCHARGE:                                 OPERATIVE REPORT   PREOPERATIVE DIAGNOSIS:  Preretinal fibrosis, left eye.  Vitreous  hemorrhage, left eye.  Branch retinal vein occlusion, left eye.   POSTOPERATIVE DIAGNOSIS:  Preretinal fibrosis, left eye.  Vitreous  hemorrhage, left eye.  Branch retinal vein occlusion, left eye.   OPERATION PERFORMED:  Pars plana vitrectomy with membrane peel, retinal  photocoagulation, gas-fluid exchange in the left eye.   SURGEON:  Beulah Gandy. Ashley Royalty, M.D.   ASSISTANT:  Rosalie Doctor, MA   ANESTHESIA:  General.   DESCRIPTION OF PROCEDURE:  Usual prep and drape.  25 gauge trocar insertion  at 10, 2 and 4 o'clock.  The 25g infusion port was anchored into place at 4  o'clock.  The endo illumination and the vitreous cutter were placed at 10  and 2 o'clock respectively.  Pars plana vitrectomy was begun just behind the  crystalline lens.  The vitrectomy was carried down to the macular surface  where preretinal fibrosis was seen.  This was teased up with the vitreous  cutter and the Revolution forceps were placed through the 25 gauge trocar  into the vitreous to grasp the membrane and peel it from its attachment to  the disk and the macula.  The membrane was then excised with the vitreous  cutter out 360 degrees to the vitreous base.  Scleral depression was used to  gain access to the 6 o'clock vitreous base.  Attention was carried to the  upper temporal quadrant where some vitreous hemorrhage and  neovascularization was seen.  This was treated and removed with the vitreous  cutter.  The endolaser was placed in the eye and 124 burns were placed  around the area of  neovascularization. The power was 800 mW, 1000 microns  each and 0.1 seconds each.  A 50% gas-fluid exchange was then carried out  with sterile room air.  The instruments and trocars were removed from the  eye and the wounds were checked for leakage.  There was no leakage.  Polymyxin and gentamicin were irrigated into Tenon's space.  Atropine  solution was applied.  Decadron 10 mg was injected into the lower  subconjunctival space.  Marcaine was injected around the globe for  postoperative pain.  The closing tension was 10 with a Barraquer tonometer.   COMPLICATIONS:  None.   DURATION:  One hour.   The patient was awakened and taken to recovery in satisfactory condition  after Polysporin, a patch and shield.      John   JDM/MEDQ  D:  01/26/2004  T:  01/26/2004  Job:  [id number]

## 2010-06-25 ENCOUNTER — Other Ambulatory Visit (HOSPITAL_COMMUNITY): Payer: Self-pay

## 2010-07-02 ENCOUNTER — Ambulatory Visit (HOSPITAL_COMMUNITY): Payer: Self-pay | Admitting: Oncology

## 2010-07-03 ENCOUNTER — Ambulatory Visit (HOSPITAL_COMMUNITY): Payer: Self-pay | Admitting: Oncology

## 2010-07-17 ENCOUNTER — Ambulatory Visit (HOSPITAL_COMMUNITY): Payer: BC Managed Care – PPO | Admitting: Oncology

## 2010-07-17 ENCOUNTER — Ambulatory Visit (HOSPITAL_COMMUNITY): Payer: Self-pay | Admitting: Oncology

## 2010-09-08 ENCOUNTER — Emergency Department (HOSPITAL_COMMUNITY)
Admission: EM | Admit: 2010-09-08 | Discharge: 2010-09-08 | Disposition: A | Discharge status: Home / Self Care | Payer: BC Managed Care – PPO | Attending: Emergency Medicine | Admitting: Emergency Medicine

## 2010-09-08 DIAGNOSIS — T63461A Toxic effect of venom of wasps, accidental (unintentional), initial encounter: Secondary | ICD-10-CM | POA: Insufficient documentation

## 2010-09-08 DIAGNOSIS — R229 Localized swelling, mass and lump, unspecified: Secondary | ICD-10-CM | POA: Insufficient documentation

## 2010-09-08 DIAGNOSIS — E785 Hyperlipidemia, unspecified: Secondary | ICD-10-CM | POA: Insufficient documentation

## 2010-09-08 DIAGNOSIS — H5789 Other specified disorders of eye and adnexa: Secondary | ICD-10-CM | POA: Insufficient documentation

## 2010-09-08 DIAGNOSIS — E119 Type 2 diabetes mellitus without complications: Secondary | ICD-10-CM | POA: Insufficient documentation

## 2010-09-08 DIAGNOSIS — I10 Essential (primary) hypertension: Secondary | ICD-10-CM | POA: Insufficient documentation

## 2010-09-08 DIAGNOSIS — T6391XA Toxic effect of contact with unspecified venomous animal, accidental (unintentional), initial encounter: Secondary | ICD-10-CM | POA: Insufficient documentation

## 2010-09-11 ENCOUNTER — Encounter: Payer: Self-pay | Admitting: *Deleted

## 2010-09-12 ENCOUNTER — Encounter: Payer: Self-pay | Admitting: Endocrinology

## 2010-09-12 ENCOUNTER — Other Ambulatory Visit (INDEPENDENT_AMBULATORY_CARE_PROVIDER_SITE_OTHER): Payer: BC Managed Care – PPO

## 2010-09-12 ENCOUNTER — Ambulatory Visit (INDEPENDENT_AMBULATORY_CARE_PROVIDER_SITE_OTHER): Payer: BC Managed Care – PPO | Admitting: Endocrinology

## 2010-09-12 DIAGNOSIS — E042 Nontoxic multinodular goiter: Secondary | ICD-10-CM

## 2010-09-12 DIAGNOSIS — E119 Type 2 diabetes mellitus without complications: Secondary | ICD-10-CM

## 2010-09-12 LAB — BASIC METABOLIC PANEL
BUN: 14 mg/dL (ref 6–23)
CO2: 27 mEq/L (ref 19–32)
Chloride: 109 mEq/L (ref 96–112)
Glucose, Bld: 111 mg/dL — ABNORMAL HIGH (ref 70–99)
Potassium: 4.1 mEq/L (ref 3.5–5.1)

## 2010-09-12 MED ORDER — METFORMIN HCL ER 500 MG PO TB24: 500.0000 mg | ORAL_TABLET | Freq: Every day | ORAL | Status: DC

## 2010-09-12 NOTE — Progress Notes (Signed)
Subjective:    Patient ID: Grace Cordova, female    DOB: 26-Nov-1940, 70 y.o.   MRN: [id number]  HPI Pt ret for f/u of dm (has not required any med yet).  She took metformin in the past, but she stopped it due to diarrhea.   She was seen in er last week for insect sting on the right face.  She now has 2 weeks of slight swelling of the right face, and assoc itching.  It is much better with a topical cream she was rx'ed.   She also has h/o multinodular goiter.  She had a bx a few years ago (low-risk).  She still notices the thyroid enlargement, but unchanged. Past Medical History  Diagnosis Date  . GOITER, MULTINODULAR 12/07/2009  . VITAMIN B12 DEFICIENCY 11/09/2009  . DM 11/09/2009  . ASTHMA 11/09/2009  . Hyperlipidemia   . Hypertension   . Anxiety   . Pernicious anemia     on monthly B12 injections by PCP  . Colon polyps 2009    Past Surgical History  Procedure Date  . Cesarean section   . Uterine fibroid surgery     x2    History   Social History  . Marital Status: Divorced    Spouse Name: N/A    Number of Children: 2  . Years of Education: N/A   Occupational History  . Adminstrative Assistant     works in Education officer, museum with Teacher, music, RNs   Social History Main Topics  . Smoking status: Former Smoker    Quit date: 01/21/1989  . Smokeless tobacco: Not on file  . Alcohol Use: Yes     1 drink/day  . Drug Use: No  . Sexually Active:    Other Topics Concern  . Not on file   Social History Narrative   Daily Caffeine Use-1-2 drinks/day    Current Outpatient Prescriptions on File Prior to Visit  Medication Sig Dispense Refill  . hydrocortisone (ANUSOL-HC) 25 MG suppository Place 25 mg rectally at bedtime.        . hydrOXYzine (ATARAX) 25 MG tablet Take 25 mg by mouth daily.        Marland Kitchen losartan (COZAAR) 25 MG tablet Take 25 mg by mouth daily.        . Melatonin 5 MG CAPS Take 1 capsule by mouth at bedtime.        . naproxen sodium (ALEVE) 220 MG tablet Take  220 mg by mouth as needed.        . simvastatin (ZOCOR) 40 MG tablet Take 40 mg by mouth daily.          Allergies  Allergen Reactions  . Penicillins   . Sulfonamide Derivatives     Family History  Problem Relation Age of Onset  . Goiter Mother     had resection of benign goiter  . Diabetes Mother   . Heart disease Mother   . Diabetes Father   . Heart disease Father   . Cancer Sister     Half sister-Breast Cancer  . Cancer Brother 37    Half brother-Colon Cancer  . Diabetes Maternal Grandmother   . Diabetes Paternal Grandmother   . Cancer Cousin     Colon Cancer-dx age 15s  . Cancer Other     Nephew-Colon Cancer   BP 112/68  Pulse 76  Temp(Src) 98.6 F (37 C) (Oral)  Ht 5\' 5"  (1.651 m)  Wt 203 lb 9.6 oz (92.352 kg)  BMI  33.88 kg/m2  SpO2 95%  Review of Systems Denies fever and weight change    Objective:   Physical Exam Neck: 5x normal size, multinodular goiter. Pulses: dorsalis pedis intact bilat.   Feet: no deformity.  no ulcer on the feet.  feet are of normal color and temp.  no edema Neuro: sensation is intact to touch on the feet Lab Results  Component Value Date   HGBA1C 6.4 09/12/2010   Lab Results  Component Value Date   TSH 1.09 09/12/2010  bmet=normal    Assessment & Plan:  Dm, Needs increased rx, if it can be done with a regimen that avoids or minimizes hypoglycemia. Multinodular goiter.  She is euthyroid now, but she is at risk for hyperthyroidism. Insect sting, much improved

## 2010-09-12 NOTE — Patient Instructions (Addendum)
blood tests are being requested for you today.  please call [phone] to hear your test results.  You will be prompted to enter the 9-digit "MRN" number that appears at the top left of this page, followed by #.  Then you will hear the message. Based on the results, i may advise you to try "metformin" again. Please return in 1 year.   good diet and exercise habits significanly improve the control of your diabetes.  please let me know if you wish to be referred to a dietician.  high blood sugar is very risky to your health.  you should see an eye doctor every year. controlling your blood pressure and cholesterol drastically reduces the damage diabetes does to your body.  this also applies to quitting smoking.  please discuss these with your doctor.  you should take an aspirin every day, unless you have been advised by a doctor not to. (update: i left message on phone-tree:  Take metformin 500/d.  Ret 6 mos).

## 2010-10-04 ENCOUNTER — Other Ambulatory Visit: Payer: Self-pay | Admitting: *Deleted

## 2010-10-04 MED ORDER — ONETOUCH DELICA LANCETS MISC: Status: DC

## 2010-10-04 MED ORDER — GLUCOSE BLOOD VI STRP: ORAL_STRIP | Status: AC

## 2010-10-04 NOTE — Telephone Encounter (Signed)
R'cd fax from [facility] for refill of Lancets and Test strips for Onetouch Ultra 2 Meter  Last OV-09/12/2010

## 2011-03-15 ENCOUNTER — Ambulatory Visit: Payer: BC Managed Care – PPO | Admitting: Endocrinology

## 2011-03-25 ENCOUNTER — Ambulatory Visit (INDEPENDENT_AMBULATORY_CARE_PROVIDER_SITE_OTHER): Payer: BC Managed Care – PPO | Admitting: Endocrinology

## 2011-04-10 ENCOUNTER — Ambulatory Visit: Payer: BC Managed Care – PPO | Admitting: Endocrinology

## 2011-04-11 ENCOUNTER — Other Ambulatory Visit (INDEPENDENT_AMBULATORY_CARE_PROVIDER_SITE_OTHER): Payer: BC Managed Care – PPO

## 2011-04-11 ENCOUNTER — Ambulatory Visit (INDEPENDENT_AMBULATORY_CARE_PROVIDER_SITE_OTHER): Payer: BC Managed Care – PPO | Admitting: Endocrinology

## 2011-04-11 ENCOUNTER — Encounter: Payer: Self-pay | Admitting: Endocrinology

## 2011-04-11 VITALS — BP 118/62 | HR 72 | Temp 98.2°F

## 2011-04-11 DIAGNOSIS — E119 Type 2 diabetes mellitus without complications: Secondary | ICD-10-CM

## 2011-04-11 LAB — BASIC METABOLIC PANEL
Calcium: 9.1 mg/dL (ref 8.4–10.5)
GFR: 71.99 mL/min (ref >60.00)
Sodium: 139 mEq/L (ref 135–145)

## 2011-04-11 LAB — HEMOGLOBIN A1C: Hgb A1c MFr Bld: 6.3 % (ref 4.6–6.5)

## 2011-04-11 MED ORDER — TRIAMCINOLONE ACETONIDE 0.1 % EX CREA: TOPICAL_CREAM | Freq: Three times a day (TID) | CUTANEOUS | Status: AC

## 2011-04-11 NOTE — Patient Instructions (Addendum)
blood tests are being requested for you today.  You will receive a letter with results. i have sent a prescription to your pharmacy, for an anti-itch skin cream. (see letter)

## 2011-04-11 NOTE — Progress Notes (Signed)
Subjective:    Patient ID: Grace Cordova, female    DOB: September 12, 1940, 71 y.o.   MRN: [id number]  HPI Pt returns for f/u of DM (1999).  She tolerates metformin well.   Pt states 10 days of moderate itching across the upper chest, but no assoc rash.  She is unable to cite precip factor. Past Medical History  Diagnosis Date  . GOITER, MULTINODULAR 12/07/2009  . VITAMIN B12 DEFICIENCY 11/09/2009  . DM 11/09/2009  . ASTHMA 11/09/2009  . Hyperlipidemia   . Hypertension   . Anxiety   . Pernicious anemia     on monthly B12 injections by PCP  . Colon polyps 2009    Past Surgical History  Procedure Date  . Cesarean section   . Uterine fibroid surgery     x2    History   Social History  . Marital Status: Divorced    Spouse Name: N/A    Number of Children: 2  . Years of Education: N/A   Occupational History  . Adminstrative Assistant     works in Education officer, museum with Teacher, music, RNs   Social History Main Topics  . Smoking status: Former Smoker    Quit date: 01/21/1989  . Smokeless tobacco: Not on file  . Alcohol Use: Yes     1 drink/day  . Drug Use: No  . Sexually Active:    Other Topics Concern  . Not on file   Social History Narrative   Daily Caffeine Use-1-2 drinks/day    Current Outpatient Prescriptions on File Prior to Visit  Medication Sig Dispense Refill  . albuterol (PROVENTIL) 2 MG tablet Take 2 mg by mouth 4 (four) times daily. As needed for wheezing       . glucose blood (ONE TOUCH ULTRA TEST) test strip Use as instructed once daily to check blood sugar  100 each  3  . hydrocortisone (ANUSOL-HC) 25 MG suppository Place 25 mg rectally at bedtime.        . hydrOXYzine (ATARAX) 25 MG tablet Take 25 mg by mouth daily.        Marland Kitchen losartan (COZAAR) 25 MG tablet Take 25 mg by mouth daily.        . Melatonin 5 MG CAPS Take 1 capsule by mouth at bedtime.        . metFORMIN (GLUCOPHAGE-XR) 500 MG 24 hr tablet Take 1 tablet (500 mg total) by mouth daily with  breakfast.  90 tablet  3  . naproxen sodium (ALEVE) 220 MG tablet Take 220 mg by mouth as needed.        Letta Pate DELICA LANCETS MISC Use as directed to check blood sugar once daily  100 each  3  . simvastatin (ZOCOR) 40 MG tablet Take 40 mg by mouth daily.          Allergies  Allergen Reactions  . Penicillins   . Sulfonamide Derivatives     Family History  Problem Relation Age of Onset  . Goiter Mother     had resection of benign goiter  . Diabetes Mother   . Heart disease Mother   . Diabetes Father   . Heart disease Father   . Cancer Sister     Half sister-Breast Cancer  . Cancer Brother 1    Half brother-Colon Cancer  . Diabetes Maternal Grandmother   . Diabetes Paternal Grandmother   . Cancer Cousin     Colon Cancer-dx age 75s  . Cancer Other  Nephew-Colon Cancer    BP 118/62  Pulse 72  Temp(Src) 98.2 F (36.8 C) (Oral)  SpO2 93%    Review of Systems Denies weight change and diarrhea.      Objective:   Physical Exam VITAL SIGNS:  See vs page. GENERAL: no distress. Pulses: dorsalis pedis intact bilat.   Feet: no deformity.  no ulcer on the feet.  feet are of normal color and temp.  no edema Neuro: sensation is intact to touch on the feet.   Skin: no rash on the chest.    Lab Results  Component Value Date   HGBA1C 6.3 04/11/2011      Assessment & Plan:  Type 2 DM, well-controlled Rash, new, uncertain etiology

## 2011-04-12 ENCOUNTER — Telehealth: Payer: Self-pay | Admitting: *Deleted

## 2011-04-12 NOTE — Telephone Encounter (Signed)
Called pt to inform of lab results, pt informed of results. (Letter also mailed to pt.)

## 2011-04-22 ENCOUNTER — Encounter (INDEPENDENT_AMBULATORY_CARE_PROVIDER_SITE_OTHER): Payer: BC Managed Care – PPO | Admitting: Ophthalmology

## 2011-04-29 ENCOUNTER — Encounter (INDEPENDENT_AMBULATORY_CARE_PROVIDER_SITE_OTHER): Payer: BC Managed Care – PPO | Admitting: Ophthalmology

## 2011-05-07 ENCOUNTER — Encounter (INDEPENDENT_AMBULATORY_CARE_PROVIDER_SITE_OTHER): Payer: BC Managed Care – PPO | Admitting: Ophthalmology

## 2011-05-07 DIAGNOSIS — E11319 Type 2 diabetes mellitus with unspecified diabetic retinopathy without macular edema: Secondary | ICD-10-CM

## 2011-05-07 DIAGNOSIS — H43819 Vitreous degeneration, unspecified eye: Secondary | ICD-10-CM

## 2011-05-07 DIAGNOSIS — I10 Essential (primary) hypertension: Secondary | ICD-10-CM

## 2011-05-07 DIAGNOSIS — H35039 Hypertensive retinopathy, unspecified eye: Secondary | ICD-10-CM

## 2011-05-07 DIAGNOSIS — H2700 Aphakia, unspecified eye: Secondary | ICD-10-CM

## 2011-05-07 DIAGNOSIS — E1139 Type 2 diabetes mellitus with other diabetic ophthalmic complication: Secondary | ICD-10-CM

## 2011-05-07 DIAGNOSIS — H26499 Other secondary cataract, unspecified eye: Secondary | ICD-10-CM

## 2011-05-16 ENCOUNTER — Ambulatory Visit (INDEPENDENT_AMBULATORY_CARE_PROVIDER_SITE_OTHER): Payer: BC Managed Care – PPO | Admitting: Ophthalmology

## 2011-05-16 DIAGNOSIS — H2700 Aphakia, unspecified eye: Secondary | ICD-10-CM

## 2011-06-13 ENCOUNTER — Ambulatory Visit (INDEPENDENT_AMBULATORY_CARE_PROVIDER_SITE_OTHER): Payer: BC Managed Care – PPO | Admitting: Critical Care Medicine

## 2011-06-13 ENCOUNTER — Ambulatory Visit (HOSPITAL_BASED_OUTPATIENT_CLINIC_OR_DEPARTMENT_OTHER)
Admission: RE | Admit: 2011-06-13 | Discharge: 2011-06-13 | Disposition: A | Discharge status: Home / Self Care | Payer: BC Managed Care – PPO | Source: Ambulatory Visit | Attending: Critical Care Medicine | Admitting: Critical Care Medicine

## 2011-06-13 ENCOUNTER — Institutional Professional Consult (permissible substitution): Payer: BC Managed Care – PPO | Admitting: Critical Care Medicine

## 2011-06-13 ENCOUNTER — Encounter: Payer: Self-pay | Admitting: Critical Care Medicine

## 2011-06-13 VITALS — BP 128/76 | HR 68 | Temp 98.5°F | Ht 65.5 in | Wt 198.5 lb

## 2011-06-13 DIAGNOSIS — R05 Cough: Secondary | ICD-10-CM

## 2011-06-13 DIAGNOSIS — R059 Cough, unspecified: Secondary | ICD-10-CM | POA: Insufficient documentation

## 2011-06-13 DIAGNOSIS — J45909 Unspecified asthma, uncomplicated: Secondary | ICD-10-CM | POA: Insufficient documentation

## 2011-06-13 MED ORDER — BUDESONIDE-FORMOTEROL FUMARATE 160-4.5 MCG/ACT IN AERO: 2.0000 | INHALATION_SPRAY | Freq: Two times a day (BID) | RESPIRATORY_TRACT | Status: DC

## 2011-06-13 MED ORDER — OMEPRAZOLE 20 MG PO CPDR: 20.0000 mg | DELAYED_RELEASE_CAPSULE | Freq: Every day | ORAL | Status: DC

## 2011-06-13 MED ORDER — PREDNISONE 10 MG PO TABS: ORAL_TABLET | ORAL | Status: DC

## 2011-06-13 NOTE — Patient Instructions (Signed)
Prednisone 10mg  Take 4 for three days 3 for three days 2 for three days 1 for three days and stop Stop albuterol tablets Start Symbicort two puff twice daily Start omeprazole one daily Follow reflux diet Chest xray today We may want to do an abdominal CT, will call later regarding this Return 1 month

## 2011-06-13 NOTE — Progress Notes (Signed)
Quick Note:  Called pt's home and cell #s - [email or web address] ______ 

## 2011-06-13 NOTE — Progress Notes (Signed)
Subjective:    Patient ID: Grace Cordova, female    DOB: 14-May-1940, 71 y.o.   MRN: [id number]  HPI Comments: Chronic cough 3-4 months persistently, intermittent >20yr. Rx albuterol seems to help. Dx with asthma  Cough This is a recurrent problem. The current episode started more than 1 year ago. The problem has been gradually worsening. The problem occurs hourly. The cough is productive of sputum and productive of purulent sputum (occ non productive). Associated symptoms include ear pain, heartburn, a rash, shortness of breath and wheezing. Pertinent negatives include no chest pain, chills, fever, headaches, hemoptysis, myalgias, nasal congestion, postnasal drip, rhinorrhea or sore throat. Associated symptoms comments: Pt notes dyspnea on exertion. The symptoms are aggravated by stress, lying down, dust, fumes and pollens (laughing, wheezing worse at night esp if lay on left side). Risk factors for lung disease include smoking/tobacco exposure. Treatments tried: oral albuterol. The treatment provided mild relief. Her past medical history is significant for asthma. There is no history of bronchiectasis, bronchitis, COPD, emphysema, environmental allergies or pneumonia.    Past Medical History  Diagnosis Date  . GOITER, MULTINODULAR 12/07/2009  . VITAMIN B12 DEFICIENCY 11/09/2009  . DM 11/09/2009  . ASTHMA 11/09/2009  . Hyperlipidemia   . Hypertension   . Anxiety   . Pernicious anemia     on monthly B12 injections by PCP  . Colon polyps 2009     Family History  Problem Relation Age of Onset  . Goiter Mother     had resection of benign goiter  . Diabetes Mother   . Heart disease Mother   . Diabetes Father   . Heart disease Father   . Cancer Sister     Half sister-Breast Cancer  . Cancer Brother 52    Half brother-Colon Cancer  . Diabetes Maternal Grandmother   . Diabetes Paternal Grandmother   . Cancer Cousin     Colon Cancer-dx age 1s  . Cancer Other     Nephew-Colon Cancer       History   Social History  . Marital Status: Divorced    Spouse Name: N/A    Number of Children: 2  . Years of Education: N/A   Occupational History  . Adminstrative Assistant     works in Education officer, museum with Teacher, music, RNs   Social History Main Topics  . Smoking status: Former Smoker -- 1.0 packs/day for 25 years    Types: Cigarettes    Quit date: 01/21/1986  . Smokeless tobacco: Never Used  . Alcohol Use: Yes     wine with dinner on the weekends  . Drug Use: No  . Sexually Active: Not on file   Other Topics Concern  . Not on file   Social History Narrative   Daily Caffeine Use-1-2 drinks/day     Allergies  Allergen Reactions  . Penicillins   . Sulfonamide Derivatives      Outpatient Prescriptions Prior to Visit  Medication Sig Dispense Refill  . glucose blood (ONE TOUCH ULTRA TEST) test strip Use as instructed once daily to check blood sugar  100 each  3  . hydrOXYzine (ATARAX) 25 MG tablet Take 25 mg by mouth daily.        Marland Kitchen losartan (COZAAR) 25 MG tablet Take 25 mg by mouth daily.        . Melatonin 5 MG CAPS Take 10 mg by mouth at bedtime.       . metFORMIN (GLUCOPHAGE-XR) 500 MG 24 hr tablet  Take 1 tablet (500 mg total) by mouth daily with breakfast.  90 tablet  3  . ONETOUCH DELICA LANCETS MISC Use as directed to check blood sugar once daily  100 each  3  . simvastatin (ZOCOR) 40 MG tablet Take 40 mg by mouth daily.        Marland Kitchen triamcinolone cream (KENALOG) 0.1 % Apply topically 3 (three) times daily. As needed for rash  45 g  1  . albuterol (PROVENTIL) 2 MG tablet Take 2 mg by mouth 4 (four) times daily.       . hydrocortisone (ANUSOL-HC) 25 MG suppository Place 25 mg rectally at bedtime.        . naproxen sodium (ALEVE) 220 MG tablet Take 220 mg by mouth as needed.            Review of Systems  Constitutional: Negative for fever, chills, diaphoresis, activity change, appetite change, fatigue and unexpected weight change.  HENT: Positive for ear  pain, congestion and sinus pressure. Negative for hearing loss, nosebleeds, sore throat, facial swelling, rhinorrhea, sneezing, mouth sores, trouble swallowing, neck pain, neck stiffness, dental problem, voice change, postnasal drip, tinnitus and ear discharge.   Eyes: Negative for photophobia, discharge, itching and visual disturbance.  Respiratory: Positive for cough, shortness of breath and wheezing. Negative for apnea, hemoptysis, choking, chest tightness and stridor.   Cardiovascular: Negative for chest pain, palpitations and leg swelling.  Gastrointestinal: Positive for heartburn, abdominal pain and abdominal distention. Negative for nausea, vomiting, constipation and blood in stool.       Notes LUQ abdominal pain, worse if bends over  Genitourinary: Positive for frequency. Negative for dysuria, urgency, hematuria, flank pain, decreased urine volume and difficulty urinating.  Musculoskeletal: Positive for joint swelling and arthralgias. Negative for myalgias, back pain and gait problem.  Skin: Positive for rash. Negative for color change and pallor.  Neurological: Negative for dizziness, tremors, seizures, syncope, speech difficulty, weakness, light-headedness, numbness and headaches.  Hematological: Negative for environmental allergies and adenopathy. Does not bruise/bleed easily.  Psychiatric/Behavioral: Positive for sleep disturbance. Negative for confusion and agitation. The patient is nervous/anxious.        Objective:   Physical Exam  Filed Vitals:   06/13/11 0938  BP: 128/76  Pulse: 68  Temp: 98.5 F (36.9 C)  TempSrc: Oral  Height: 5' 5.5" (1.664 m)  Weight: 198 lb 8 oz (90.039 kg)  SpO2: 94%    Gen: Pleasant, well-nourished, in no distress,  normal affect  ENT: No lesions,  mouth clear,  oropharynx clear, no postnasal drip  Neck: No JVD, no TMG, no carotid bruits  Lungs: No use of accessory muscles, no dullness to percussion, expired wheezes, poor  airflow  Cardiovascular: RRR, heart sounds normal, no murmur or gallops, no peripheral edema  Abdomen: soft and NT, no HSM,  BS normal  Musculoskeletal: No deformities, no cyanosis or clubbing  Neuro: alert, non focal  Skin: Warm, no lesions or rashes  Dg Chest 2 View  06/13/2011  *RADIOLOGY REPORT*  Clinical Data: Cough, asthma.  CHEST - 2 VIEW  Comparison: 03/07/2009  Findings: Heart and mediastinal contours are within normal limits. No focal opacities or effusions.  No acute bony abnormality.  IMPRESSION: No active cardiopulmonary disease.  Original Report Authenticated By: Cyndie Chime, M.D.         Assessment & Plan:   Obstructive chronic bronchitis with exacerbation Cyclical chronic cough on the basis of lower airway inflammation and obstruction with asthmatic bronchitis. Chest x-ray  was noted to be normal Reflux disease likely playing a role Plan Prednisone 10mg  Take 4 for three days 3 for three days 2 for three days 1 for three days and stop Stop albuterol tablets Start Symbicort two puff twice daily Start omeprazole one daily Follow reflux diet     Updated Medication List Outpatient Encounter Prescriptions as of 06/13/2011  Medication Sig Dispense Refill  . glucose blood (ONE TOUCH ULTRA TEST) test strip Use as instructed once daily to check blood sugar  100 each  3  . hydrOXYzine (ATARAX) 25 MG tablet Take 25 mg by mouth daily.        Marland Kitchen losartan (COZAAR) 25 MG tablet Take 25 mg by mouth daily.        . Melatonin 5 MG CAPS Take 10 mg by mouth at bedtime.       . meloxicam (MOBIC) 15 MG tablet Take 1 tablet by mouth as needed.      . metFORMIN (GLUCOPHAGE-XR) 500 MG 24 hr tablet Take 1 tablet (500 mg total) by mouth daily with breakfast.  90 tablet  3  . ONETOUCH DELICA LANCETS MISC Use as directed to check blood sugar once daily  100 each  3  . simvastatin (ZOCOR) 40 MG tablet Take 40 mg by mouth daily.        Marland Kitchen triamcinolone cream (KENALOG) 0.1 % Apply  topically 3 (three) times daily. As needed for rash  45 g  1  . DISCONTD: albuterol (PROVENTIL) 2 MG tablet Take 2 mg by mouth 4 (four) times daily.       . budesonide-formoterol (SYMBICORT) 160-4.5 MCG/ACT inhaler Inhale 2 puffs into the lungs 2 (two) times daily.  1 Inhaler  12  . omeprazole (PRILOSEC) 20 MG capsule Take 1 capsule (20 mg total) by mouth daily.  30 capsule  4  . predniSONE (DELTASONE) 10 MG tablet Take 4 for three days 3 for three days 2 for three days 1 for three days and stop  30 tablet  0  . DISCONTD: hydrocortisone (ANUSOL-HC) 25 MG suppository Place 25 mg rectally at bedtime.        Marland Kitchen DISCONTD: naproxen sodium (ALEVE) 220 MG tablet Take 220 mg by mouth as needed.

## 2011-06-13 NOTE — Assessment & Plan Note (Signed)
Cyclical chronic cough on the basis of lower airway inflammation and obstruction with asthmatic bronchitis. Chest x-ray was noted to be normal Reflux disease likely playing a role Plan Prednisone 10mg  Take 4 for three days 3 for three days 2 for three days 1 for three days and stop Stop albuterol tablets Start Symbicort two puff twice daily Start omeprazole one daily Follow reflux diet

## 2011-06-14 ENCOUNTER — Other Ambulatory Visit (INDEPENDENT_AMBULATORY_CARE_PROVIDER_SITE_OTHER): Payer: BC Managed Care – PPO

## 2011-06-14 ENCOUNTER — Telehealth: Payer: Self-pay | Admitting: *Deleted

## 2011-06-14 DIAGNOSIS — R1012 Left upper quadrant pain: Secondary | ICD-10-CM

## 2011-06-14 LAB — BASIC METABOLIC PANEL
Calcium: 9.5 mg/dL (ref 8.4–10.5)
GFR: 71.95 mL/min (ref >60.00)
Sodium: 137 mEq/L (ref 135–145)

## 2011-06-14 NOTE — Telephone Encounter (Signed)
Schedule abdominal pelvic CT of abdomen with iv/po contrast  "chronic left upper quadrant abdominal pain" Do at [facility] CT

## 2011-06-14 NOTE — Telephone Encounter (Signed)
Addended by: Gweneth Dimitri D on: 06/14/2011 02:50 PM   Modules accepted: Orders

## 2011-06-14 NOTE — Progress Notes (Signed)
Quick Note:  Called, spoke with pt. I informed her CXR normal per Dr. Delford Field. She had further questions regarding CT Abd - phone msg created to address this. ______

## 2011-06-14 NOTE — Telephone Encounter (Signed)
Dr. Delford Field, I called pt to inform her CXR normal.  She is requesting to go ahead with CT abd now.  Her insurance will change on June 1, she has already met deductible -- would like to have this done by May 31 to save money.  Pls advise.  Thank you.   Note:  Pt requesting to communicate via her cell phone today.

## 2011-06-14 NOTE — Telephone Encounter (Signed)
Order has been placed - Rose in [place] did verify order placed correctly and was received.  Called, spoke with pt.  I informed her PW ok with order and has been placed.  Advised she will receive call back today to have this scheduled.  She verbalized understanding of this and voiced no further questions/concerns at this time.

## 2011-06-14 NOTE — Telephone Encounter (Signed)
Pt needs BMET for CT per [facility] -- order placed.

## 2011-06-18 ENCOUNTER — Ambulatory Visit (INDEPENDENT_AMBULATORY_CARE_PROVIDER_SITE_OTHER)
Admission: RE | Admit: 2011-06-18 | Discharge: 2011-06-18 | Disposition: A | Discharge status: Home / Self Care | Payer: BC Managed Care – PPO | Source: Ambulatory Visit | Attending: Critical Care Medicine | Admitting: Critical Care Medicine

## 2011-06-18 DIAGNOSIS — R1012 Left upper quadrant pain: Secondary | ICD-10-CM

## 2011-06-18 MED ORDER — IOHEXOL 300 MG/ML  SOLN
100.0000 mL | Freq: Once | INTRAMUSCULAR | Status: AC | PRN
  Administered 2011-06-18: 100 mL via INTRAVENOUS

## 2011-06-20 ENCOUNTER — Telehealth: Payer: Self-pay | Admitting: Critical Care Medicine

## 2011-06-20 NOTE — Telephone Encounter (Signed)
I reported to the patient her CT abdomen was normal. The liver lesion is of no consequence

## 2011-06-20 NOTE — Telephone Encounter (Signed)
Please advise Dr. Wright thanks 

## 2011-07-04 ENCOUNTER — Encounter: Payer: Self-pay | Admitting: Critical Care Medicine

## 2011-07-04 ENCOUNTER — Ambulatory Visit (INDEPENDENT_AMBULATORY_CARE_PROVIDER_SITE_OTHER): Payer: BC Managed Care – PPO | Admitting: Critical Care Medicine

## 2011-07-04 VITALS — BP 112/52 | HR 74 | Temp 98.7°F | Ht 65.0 in | Wt 200.0 lb

## 2011-07-04 DIAGNOSIS — J441 Chronic obstructive pulmonary disease with (acute) exacerbation: Secondary | ICD-10-CM

## 2011-07-04 MED ORDER — BUDESONIDE-FORMOTEROL FUMARATE 160-4.5 MCG/ACT IN AERO: 1.0000 | INHALATION_SPRAY | Freq: Two times a day (BID) | RESPIRATORY_TRACT | Status: DC

## 2011-07-04 MED ORDER — MOMETASONE FUROATE 220 MCG/INH IN AEPB: 2.0000 | INHALATION_SPRAY | Freq: Every day | RESPIRATORY_TRACT | Status: DC

## 2011-07-04 NOTE — Assessment & Plan Note (Addendum)
Cyclical chronic cough on the basis of lower airway inflammation and obstruction with asthmatic bronchitis. Chest x-ray was noted to be normal Reflux disease likely playing a role Now improved.  Plan Transition to Asmanex two puff daily Finish one months of PPI rx Follow reflux diet

## 2011-07-04 NOTE — Progress Notes (Signed)
Subjective:    Patient ID: Grace Cordova, female    DOB: 01/01/1941, 71 y.o.   MRN: [id number]  HPI 07/04/2011 Cough is now  Better.  No real mucus.  Notes a slight tickle in throat.  No dyspnea.  No wheezing. No heart burn.  Pt on symbicort twice daily until one week ago and changed to prn.  Pt will skip a day. Pt denies any significant sore throat, nasal congestion or excess secretions, fever, chills, sweats, unintended weight loss, pleurtic or exertional chest pain, orthopnea PND, or leg swelling Pt denies any increase in rescue therapy over baseline, denies waking up needing it or having any early am or nocturnal exacerbations of coughing/wheezing/or dyspnea. Pt also denies any obvious fluctuation in symptoms with  weather or environmental change or other alleviating or aggravating factors    Past Medical History  Diagnosis Date  . GOITER, MULTINODULAR 12/07/2009  . VITAMIN B12 DEFICIENCY 11/09/2009  . DM 11/09/2009  . ASTHMA 11/09/2009  . Hyperlipidemia   . Hypertension   . Anxiety   . Pernicious anemia     on monthly B12 injections by PCP  . Colon polyps 2009     Family History  Problem Relation Age of Onset  . Goiter Mother     had resection of benign goiter  . Diabetes Mother   . Heart disease Mother   . Diabetes Father   . Heart disease Father   . Cancer Sister     Half sister-Breast Cancer  . Cancer Brother 37    Half brother-Colon Cancer  . Diabetes Maternal Grandmother   . Diabetes Paternal Grandmother   . Cancer Cousin     Colon Cancer-dx age 63s  . Cancer Other     Nephew-Colon Cancer     History   Social History  . Marital Status: Divorced    Spouse Name: N/A    Number of Children: 2  . Years of Education: N/A   Occupational History  . Adminstrative Assistant     works in Education officer, museum with Teacher, music, RNs   Social History Main Topics  . Smoking status: Former Smoker -- 1.0 packs/day for 25 years    Types: Cigarettes    Quit date:  01/21/1986  . Smokeless tobacco: Never Used  . Alcohol Use: Yes     wine with dinner on the weekends  . Drug Use: No  . Sexually Active: Not on file   Other Topics Concern  . Not on file   Social History Narrative   Daily Caffeine Use-1-2 drinks/day     Allergies  Allergen Reactions  . Penicillins   . Sulfonamide Derivatives      Outpatient Prescriptions Prior to Visit  Medication Sig Dispense Refill  . glucose blood (ONE TOUCH ULTRA TEST) test strip Use as instructed once daily to check blood sugar  100 each  3  . hydrOXYzine (ATARAX) 25 MG tablet Take 25 mg by mouth daily.        Marland Kitchen losartan (COZAAR) 25 MG tablet Take 25 mg by mouth daily.        . Melatonin 5 MG CAPS Take 10 mg by mouth at bedtime.       . meloxicam (MOBIC) 15 MG tablet Take 1 tablet by mouth as needed.      . metFORMIN (GLUCOPHAGE-XR) 500 MG 24 hr tablet Take 1 tablet (500 mg total) by mouth daily with breakfast.  90 tablet  3  . omeprazole (PRILOSEC) 20 MG  capsule Take 1 capsule (20 mg total) by mouth daily.  30 capsule  4  . ONETOUCH DELICA LANCETS MISC Use as directed to check blood sugar once daily  100 each  3  . simvastatin (ZOCOR) 40 MG tablet Take 40 mg by mouth daily.        Marland Kitchen triamcinolone cream (KENALOG) 0.1 % Apply topically 3 (three) times daily. As needed for rash  45 g  1  . budesonide-formoterol (SYMBICORT) 160-4.5 MCG/ACT inhaler Inhale 2 puffs into the lungs 2 (two) times daily.  1 Inhaler  12  . predniSONE (DELTASONE) 10 MG tablet Take 4 for three days 3 for three days 2 for three days 1 for three days and stop  30 tablet  0      Review of Systems  Constitutional: Negative for diaphoresis, activity change, appetite change, fatigue and unexpected weight change.  HENT: Negative for hearing loss, nosebleeds, congestion, facial swelling, sneezing, mouth sores, trouble swallowing, neck pain, neck stiffness, dental problem, voice change, sinus pressure, tinnitus and ear discharge.   Eyes:  Negative for photophobia, discharge, itching and visual disturbance.  Respiratory: Negative for apnea, cough, choking, chest tightness, shortness of breath, wheezing and stridor.   Cardiovascular: Negative for palpitations and leg swelling.  Gastrointestinal: Positive for abdominal pain. Negative for nausea, vomiting, constipation, blood in stool and abdominal distention.       Notes LUQ abdominal pain, worse if bends over  Genitourinary: Positive for frequency. Negative for dysuria, urgency, hematuria, flank pain, decreased urine volume and difficulty urinating.  Musculoskeletal: Positive for joint swelling and arthralgias. Negative for back pain and gait problem.  Skin: Negative for color change and pallor.  Neurological: Negative for dizziness, tremors, seizures, syncope, speech difficulty, weakness, light-headedness and numbness.  Hematological: Negative for adenopathy. Does not bruise/bleed easily.  Psychiatric/Behavioral: Positive for disturbed wake/sleep cycle. Negative for confusion and agitation. The patient is nervous/anxious.        Objective:   Physical Exam   Filed Vitals:   07/04/11 1446 07/04/11 1449  BP: 104/48 112/52  Pulse: 74   Temp: 98.7 F (37.1 C)   TempSrc: Oral   Height: 5\' 5"  (1.651 m)   Weight: 200 lb (90.719 kg)   SpO2: 95%     Gen: Pleasant, well-nourished, in no distress,  normal affect  ENT: No lesions,  mouth clear,  oropharynx clear, no postnasal drip  Neck: No JVD, no TMG, no carotid bruits  Lungs: No use of accessory muscles, no dullness to percussion, improved airflow  Cardiovascular: RRR, heart sounds normal, no murmur or gallops, no peripheral edema  Abdomen: soft and NT, no HSM,  BS normal  Musculoskeletal: No deformities, no cyanosis or clubbing  Neuro: alert, non focal  Skin: Warm, no lesions or rashes         Assessment & Plan:   Obstructive chronic bronchitis with exacerbation Cyclical chronic cough on the basis of  lower airway inflammation and obstruction with asthmatic bronchitis. Chest x-ray was noted to be normal Reflux disease likely playing a role Now improved.  Plan Transition to Asmanex two puff daily Finish one months of PPI rx Follow reflux diet     Updated Medication List Outpatient Encounter Prescriptions as of 07/04/2011  Medication Sig Dispense Refill  . budesonide-formoterol (SYMBICORT) 160-4.5 MCG/ACT inhaler Inhale 1 puff into the lungs 2 (two) times daily. Stop when current inhaler runs out  1 Inhaler    . glucose blood (ONE TOUCH ULTRA TEST) test strip Use as  instructed once daily to check blood sugar  100 each  3  . hydrOXYzine (ATARAX) 25 MG tablet Take 25 mg by mouth daily.        Marland Kitchen losartan (COZAAR) 25 MG tablet Take 25 mg by mouth daily.        . Melatonin 5 MG CAPS Take 10 mg by mouth at bedtime.       . meloxicam (MOBIC) 15 MG tablet Take 1 tablet by mouth as needed.      . metFORMIN (GLUCOPHAGE-XR) 500 MG 24 hr tablet Take 1 tablet (500 mg total) by mouth daily with breakfast.  90 tablet  3  . omeprazole (PRILOSEC) 20 MG capsule Take 1 capsule (20 mg total) by mouth daily.  30 capsule  4  . ONETOUCH DELICA LANCETS MISC Use as directed to check blood sugar once daily  100 each  3  . simvastatin (ZOCOR) 40 MG tablet Take 40 mg by mouth daily.        Marland Kitchen triamcinolone cream (KENALOG) 0.1 % Apply topically 3 (three) times daily. As needed for rash  45 g  1  . DISCONTD: budesonide-formoterol (SYMBICORT) 160-4.5 MCG/ACT inhaler Inhale 2 puffs into the lungs 2 (two) times daily.  1 Inhaler  12  . DISCONTD: budesonide-formoterol (SYMBICORT) 160-4.5 MCG/ACT inhaler Inhale 1-2 puffs into the lungs as needed.      . mometasone (ASMANEX 120 METERED DOSES) 220 MCG/INH inhaler Inhale 2 puffs into the lungs daily. Start when symbicort runs out  1 Inhaler  12  . DISCONTD: predniSONE (DELTASONE) 10 MG tablet Take 4 for three days 3 for three days 2 for three days 1 for three days and stop   30 tablet  0

## 2011-07-04 NOTE — Patient Instructions (Addendum)
Stop omeprazole when current bottle runs out Reduce symbicort to one puff twice daily until current inhaler runs out then stop Start Asmanex two puff daily when symbicort runs out Return 4 months

## 2011-07-12 ENCOUNTER — Encounter (INDEPENDENT_AMBULATORY_CARE_PROVIDER_SITE_OTHER): Payer: BC Managed Care – PPO | Admitting: Ophthalmology

## 2011-07-12 DIAGNOSIS — H2700 Aphakia, unspecified eye: Secondary | ICD-10-CM

## 2011-08-07 ENCOUNTER — Other Ambulatory Visit: Payer: Self-pay | Admitting: *Deleted

## 2011-08-07 ENCOUNTER — Other Ambulatory Visit: Payer: Self-pay | Admitting: Pulmonary Disease

## 2011-08-07 MED ORDER — OMEPRAZOLE 20 MG PO CPDR: 20.0000 mg | DELAYED_RELEASE_CAPSULE | Freq: Every day | ORAL | Status: DC

## 2011-08-07 NOTE — Telephone Encounter (Signed)
Faxed refill request received for Symbicort 160-4.5 mcg inhaler Patient instructions at last office were to reduce symbicort to one puff twice daily until current inhaler runs out then stop. Patient will need to contact pharmacy.  Denied refill request faxed back to pharmacy.

## 2011-08-09 ENCOUNTER — Other Ambulatory Visit: Payer: Self-pay | Admitting: Critical Care Medicine

## 2011-08-09 MED ORDER — OMEPRAZOLE 20 MG PO CPDR: 20.0000 mg | DELAYED_RELEASE_CAPSULE | Freq: Every day | ORAL | Status: DC

## 2011-08-09 NOTE — Telephone Encounter (Signed)
[facility] requires 90 day supply Omeprazole 20 mg 1 qd #90 x1 rx sent

## 2011-08-13 ENCOUNTER — Telehealth: Payer: Self-pay | Admitting: Critical Care Medicine

## 2011-08-13 MED ORDER — MOMETASONE FUROATE 220 MCG/INH IN AEPB: 2.0000 | INHALATION_SPRAY | Freq: Every day | RESPIRATORY_TRACT | Status: DC

## 2011-08-13 NOTE — Telephone Encounter (Signed)
I spoke with pt and she states she does not remember if she was ever giving the asmanex rx bc she can't fine it. I advised pt will resend rx and nothing further was needed

## 2011-08-28 ENCOUNTER — Telehealth: Payer: Self-pay | Admitting: Critical Care Medicine

## 2011-08-28 NOTE — Telephone Encounter (Signed)
Per Dr. Lynelle Doctor last OV note: Patient Instructions     Stop omeprazole when current bottle runs out  Reduce symbicort to one puff twice daily until current inhaler runs out then stop  Start Asmanex two puff daily when symbicort runs out  Return 4 months    lmomtcb x1 for pt

## 2011-08-29 NOTE — Telephone Encounter (Signed)
LMOM TCB x2.  Omeprazole 20mg  #30 was refilled on 7.17.13, then again for a 90 day supply on 7.19.13.  May have been an automatic request from the pharmacy.  Per PW's ov note, she is not to take this medication.

## 2011-08-29 NOTE — Telephone Encounter (Signed)
Patient notified of pt instructions per PW at the last OV to stop the Omeprazole. She will contact the pharmacy to see if they will take the medication back since she has already picked this up.I will remove the Omeprazole from the pt's med list since it has been D/C'd by PW.

## 2011-09-09 ENCOUNTER — Telehealth: Payer: Self-pay | Admitting: Critical Care Medicine

## 2011-09-09 MED ORDER — MOMETASONE FUROATE 220 MCG/INH IN AEPB: 2.0000 | INHALATION_SPRAY | Freq: Every day | RESPIRATORY_TRACT | Status: DC

## 2011-09-09 NOTE — Telephone Encounter (Signed)
Left message on voicemail that phone note has been taken care of and if any other questions or concerns to please call the office. RX sent.

## 2011-10-10 ENCOUNTER — Other Ambulatory Visit: Payer: Self-pay | Admitting: Endocrinology

## 2011-10-11 ENCOUNTER — Other Ambulatory Visit: Payer: Self-pay | Admitting: General Practice

## 2011-10-11 MED ORDER — METFORMIN HCL ER 500 MG PO TB24: 500.0000 mg | ORAL_TABLET | Freq: Every day | ORAL | Status: DC

## 2011-10-28 IMAGING — US US BIOPSY
1 series · 9 of 9 positions shown · non-contrast
Comparison: none

Clinical Data/Indication: Bilateral dominant thyroid nodules.

ULTRASOUND-GUIDED BIOPSY OF BILATERAL DOMINANT THYROID NODULES.
FINE NEEDLE ASPIRATION.
Procedure: The procedure, risks, benefits, and alternatives were
explained to the patient. Questions regarding the procedure were
encouraged and answered. The patient understands and consents to
the procedure.
The neck was prepped with betadine in a sterile fashion, and a
sterile drape was applied covering the operative field.
Under sonographic guidance, three 25 gauge fine needle aspirates of
the dominant right thyroid nodule were obtained, and three 25 gauge
fine needle aspirates of the dominant left thyroid nodule were
obtained. Final imaging was performed.
Patient tolerated the procedure well without complication.  Vital
sign monitoring by nursing staff during the procedure will continue
as patient is in the special procedures unit for post procedure
observation.

[Series 1: us biopsy · 9 acquisitions, 9 frames shown]
[im 1/9]
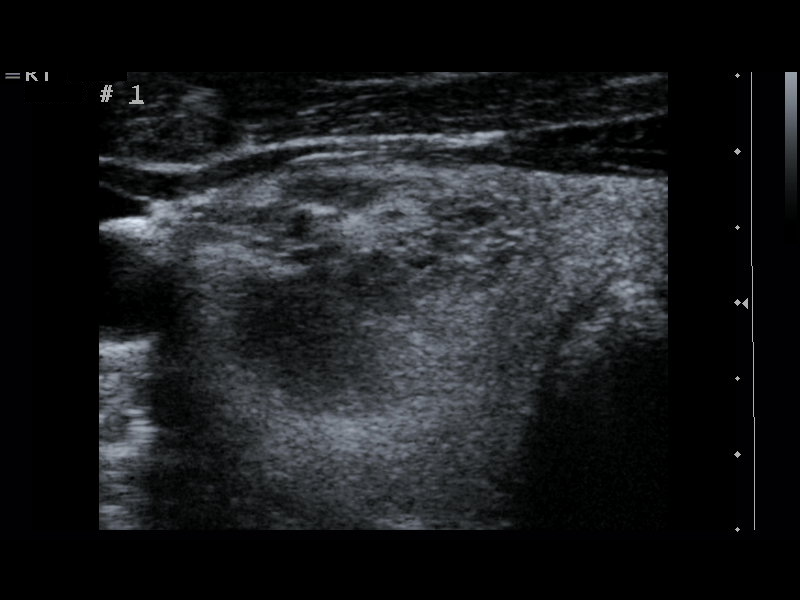
[im 2/9]
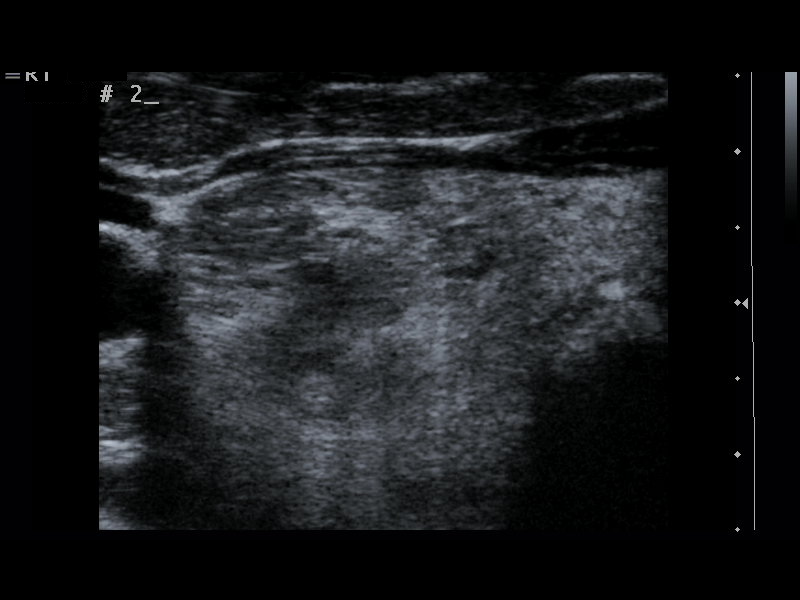
[im 3/9]
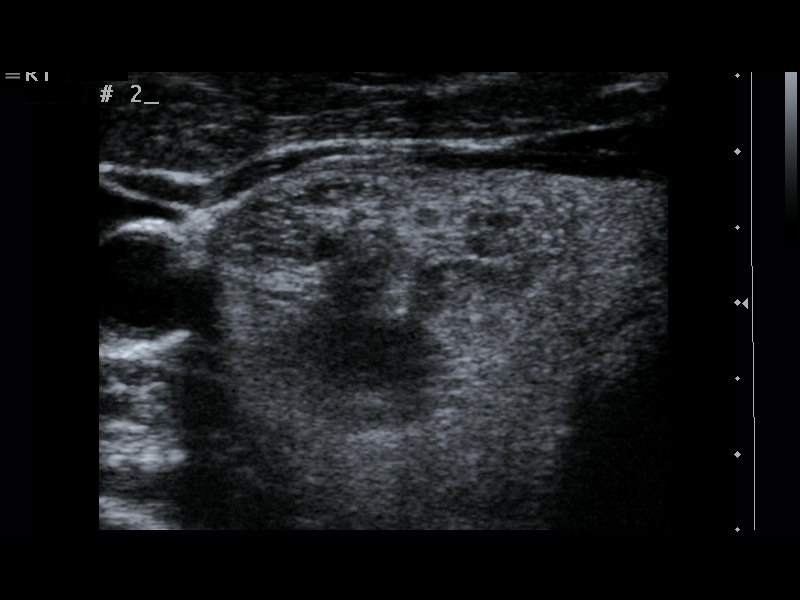
[im 4/9]
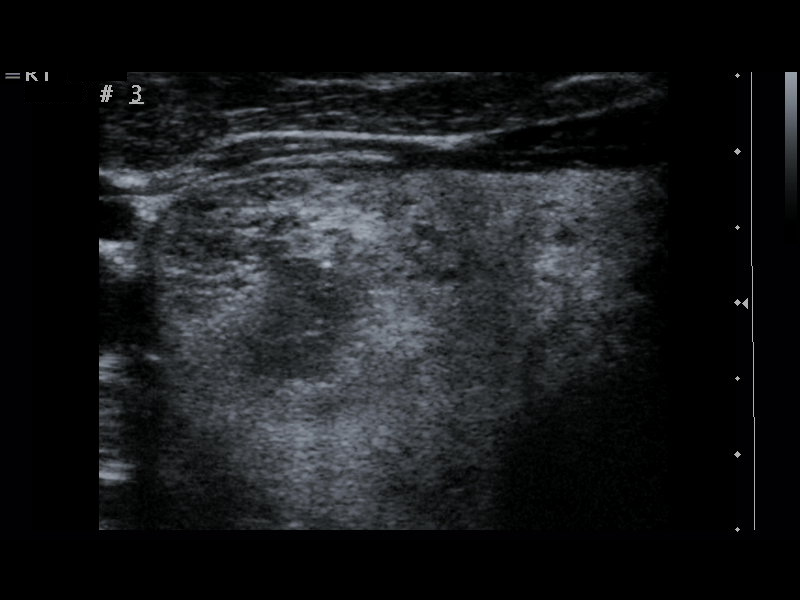
[im 5/9]
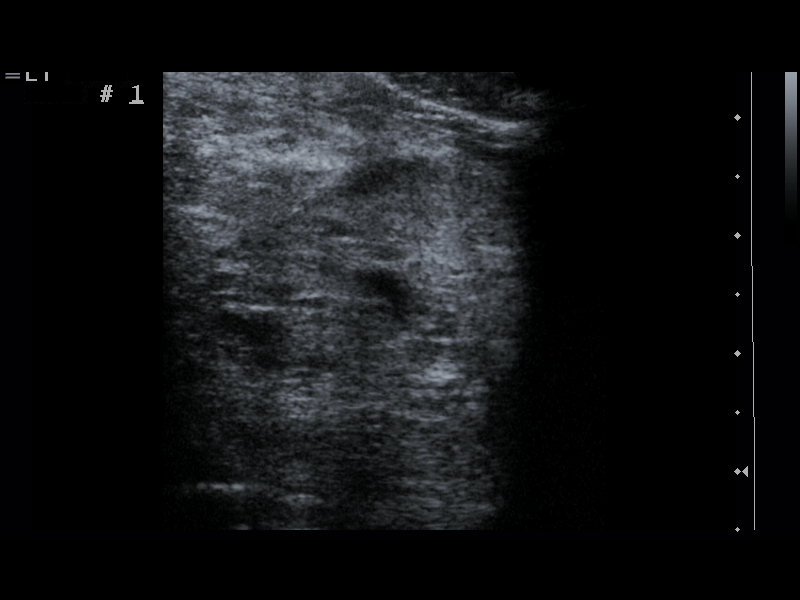
[im 6/9]
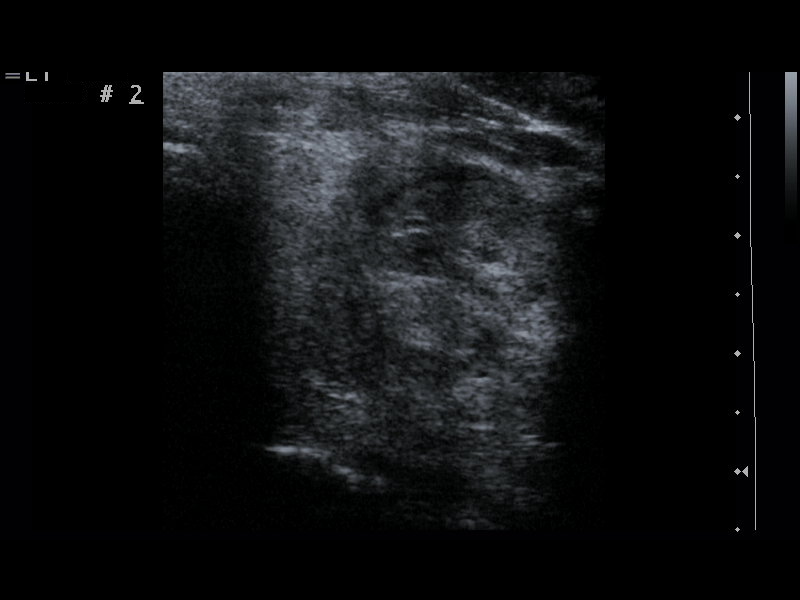
[im 7/9]
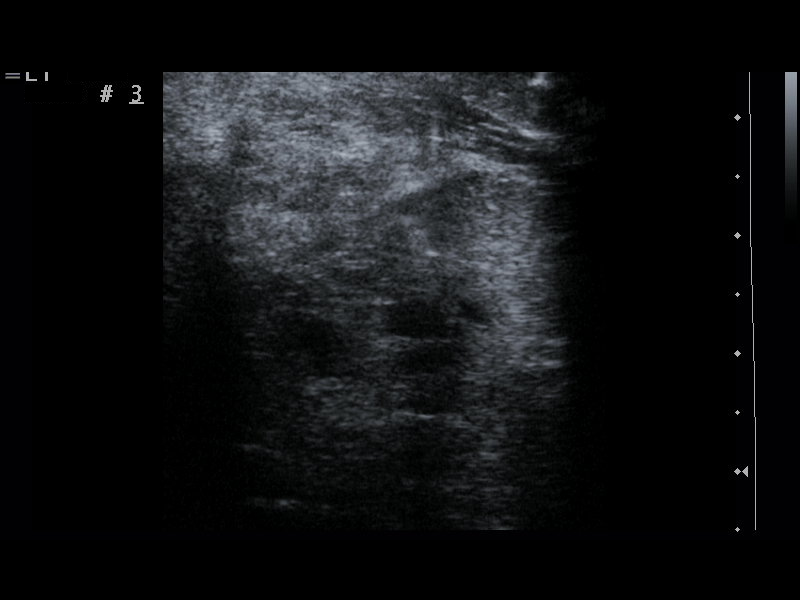
[im 8/9]
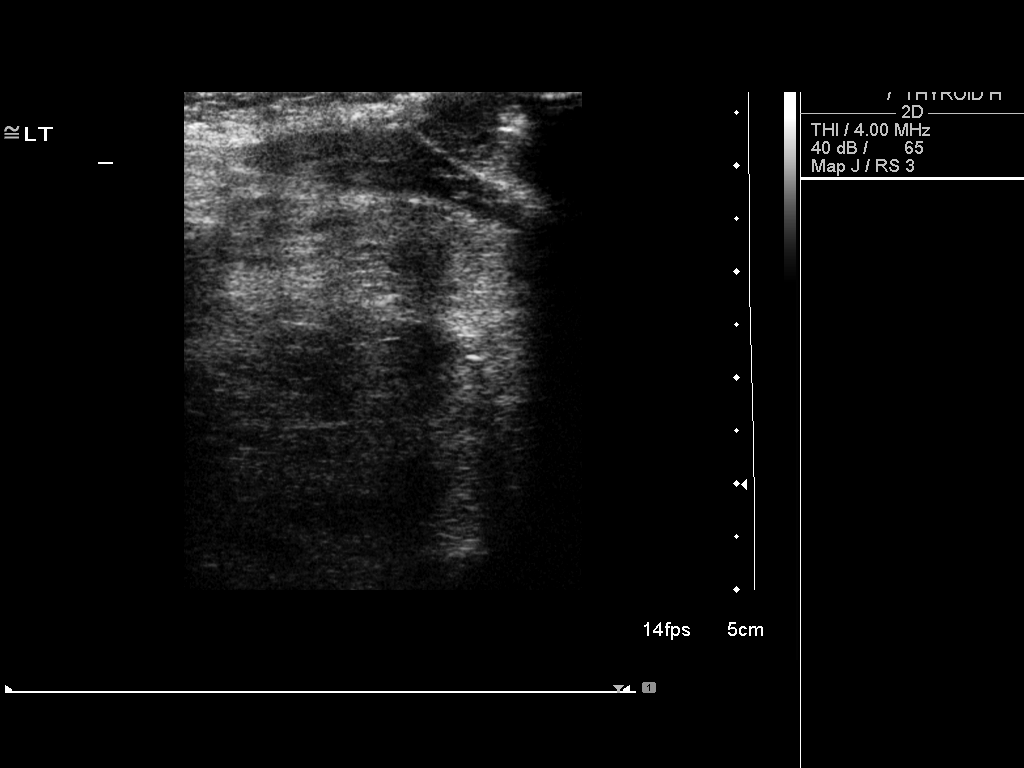
[im 9/9]
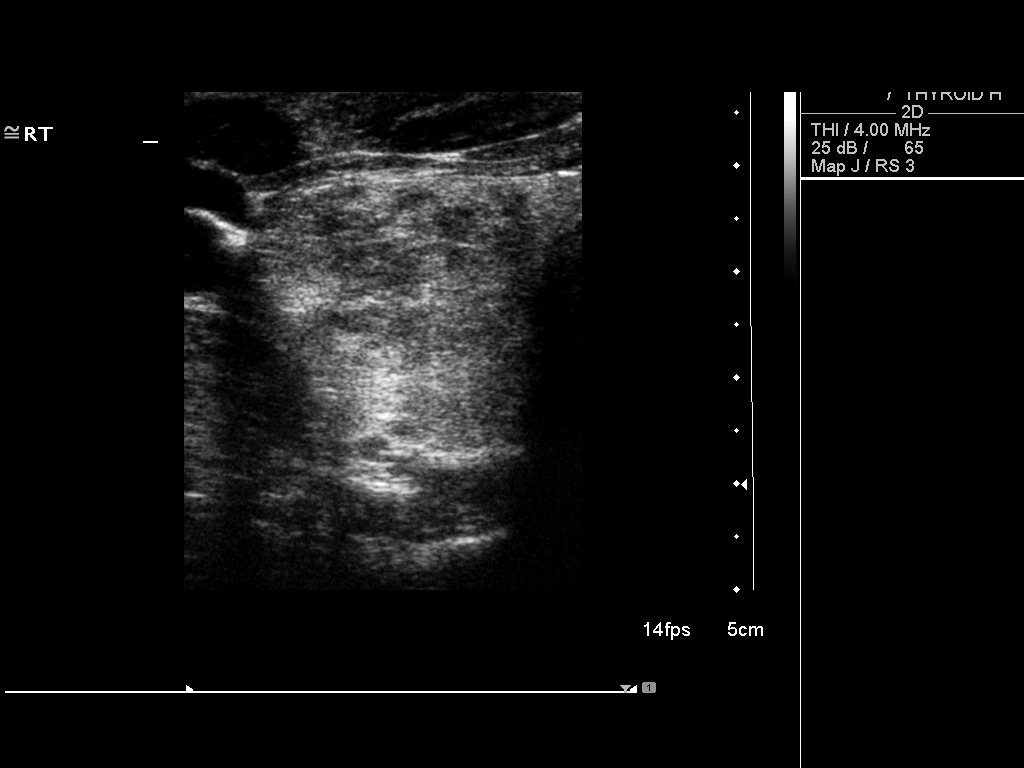

[9 of 9 positions shown; findings below may reference images not displayed]

FINDINGS: The images document guide needle placement within the
bilateral dominant thyroid nodules. Post biopsy images demonstrate
no hemorrhage.
IMPRESSION: Successful ultrasound-guided fine needle aspiration biopsy of
bilateral dominant thyroid nodules.

## 2011-12-02 ENCOUNTER — Encounter: Payer: Self-pay | Admitting: Critical Care Medicine

## 2011-12-02 ENCOUNTER — Ambulatory Visit (INDEPENDENT_AMBULATORY_CARE_PROVIDER_SITE_OTHER): Payer: BC Managed Care – PPO | Admitting: Critical Care Medicine

## 2011-12-02 VITALS — BP 110/60 | HR 54 | Temp 97.8°F | Ht 66.0 in | Wt 207.0 lb

## 2011-12-02 DIAGNOSIS — J45909 Unspecified asthma, uncomplicated: Secondary | ICD-10-CM

## 2011-12-02 DIAGNOSIS — K219 Gastro-esophageal reflux disease without esophagitis: Secondary | ICD-10-CM

## 2011-12-02 NOTE — Patient Instructions (Addendum)
Stay on one puff daily asmanex Return 6 months

## 2011-12-02 NOTE — Progress Notes (Signed)
Subjective:    Patient ID: Grace Cordova, female    DOB: Dec 09, 1940, 71 y.o.   MRN: [id number]  HPI  07/04/2011 Cough is now  Better.  No real mucus.  Notes a slight tickle in throat.  No dyspnea.  No wheezing. No heart burn.  Pt on symbicort twice daily until one week ago and changed to prn.  Pt will skip a day. Pt denies any significant sore throat, nasal congestion or excess secretions, fever, chills, sweats, unintended weight loss, pleurtic or exertional chest pain, orthopnea PND, or leg swelling Pt denies any increase in rescue therapy over baseline, denies waking up needing it or having any early am or nocturnal exacerbations of coughing/wheezing/or dyspnea. Pt also denies any obvious fluctuation in symptoms with  weather or environmental change or other alleviating or aggravating factors  12/02/2011 Cough continues to be better.  Pt on asmanex one puff daily.  No real dyspnea.  Now off PPI. Occ in AM will have a taste in the mouth.  Follows reflux diet +/-.  Drinks herbal tea.  No qhs dyspnea or cough. Pt denies any significant sore throat, nasal congestion or excess secretions, fever, chills, sweats, unintended weight loss, pleurtic or exertional chest pain, orthopnea PND, or leg swelling Pt denies any increase in rescue therapy over baseline, denies waking up needing it or having any early am or nocturnal exacerbations of coughing/wheezing/or dyspnea. Pt also denies any obvious fluctuation in symptoms with  weather or environmental change or other alleviating or aggravating factors    Past Medical History  Diagnosis Date  . GOITER, MULTINODULAR 12/07/2009  . VITAMIN B12 DEFICIENCY 11/09/2009  . DM 11/09/2009  . ASTHMA 11/09/2009  . Hyperlipidemia   . Hypertension   . Anxiety   . Pernicious anemia     on monthly B12 injections by PCP  . Colon polyps 2009     Family History  Problem Relation Age of Onset  . Goiter Mother     had resection of benign goiter  . Diabetes  Mother   . Heart disease Mother   . Diabetes Father   . Heart disease Father   . Cancer Sister     Half sister-Breast Cancer  . Cancer Brother 33    Half brother-Colon Cancer  . Diabetes Maternal Grandmother   . Diabetes Paternal Grandmother   . Cancer Cousin     Colon Cancer-dx age 37s  . Cancer Other     Nephew-Colon Cancer     History   Social History  . Marital Status: Divorced    Spouse Name: N/A    Number of Children: 2  . Years of Education: N/A   Occupational History  . Adminstrative Assistant     works in Education officer, museum with Teacher, music, RNs   Social History Main Topics  . Smoking status: Former Smoker -- 1.0 packs/day for 25 years    Types: Cigarettes    Quit date: 01/21/1986  . Smokeless tobacco: Never Used  . Alcohol Use: Yes     Comment: wine with dinner on the weekends  . Drug Use: No  . Sexually Active: Not on file   Other Topics Concern  . Not on file   Social History Narrative   Daily Caffeine Use-1-2 drinks/day     Allergies  Allergen Reactions  . Penicillins   . Sulfonamide Derivatives      Outpatient Prescriptions Prior to Visit  Medication Sig Dispense Refill  . hydrOXYzine (ATARAX) 25 MG tablet Take  25 mg by mouth daily.        Marland Kitchen losartan (COZAAR) 25 MG tablet Take 25 mg by mouth daily.        . Melatonin 5 MG CAPS Take 10 mg by mouth at bedtime.       . meloxicam (MOBIC) 15 MG tablet Take 1 tablet by mouth as needed.      . metFORMIN (GLUCOPHAGE-XR) 500 MG 24 hr tablet Take 1 tablet (500 mg total) by mouth daily with breakfast.  90 tablet  3  . ONETOUCH DELICA LANCETS MISC Use as directed to check blood sugar once daily  100 each  3  . simvastatin (ZOCOR) 40 MG tablet Take 40 mg by mouth daily.        Marland Kitchen triamcinolone cream (KENALOG) 0.1 % Apply topically 3 (three) times daily. As needed for rash  45 g  1  . mometasone (ASMANEX 120 METERED DOSES) 220 MCG/INH inhaler Inhale 2 puffs into the lungs daily.  3 Inhaler  3  .  budesonide-formoterol (SYMBICORT) 160-4.5 MCG/ACT inhaler Inhale 1 puff into the lungs 2 (two) times daily. Stop when current inhaler runs out  1 Inhaler        Review of Systems  Constitutional: Negative for diaphoresis, activity change, appetite change, fatigue and unexpected weight change.  HENT: Negative for hearing loss, nosebleeds, congestion, facial swelling, sneezing, mouth sores, trouble swallowing, neck pain, neck stiffness, dental problem, voice change, sinus pressure, tinnitus and ear discharge.   Eyes: Negative for photophobia, discharge, itching and visual disturbance.  Respiratory: Negative for apnea, cough, choking, chest tightness, shortness of breath, wheezing and stridor.   Cardiovascular: Negative for palpitations and leg swelling.  Gastrointestinal: Positive for abdominal pain. Negative for nausea, vomiting, constipation, blood in stool and abdominal distention.       Notes LUQ abdominal pain, worse if bends over  Genitourinary: Positive for frequency. Negative for dysuria, urgency, hematuria, flank pain, decreased urine volume and difficulty urinating.  Musculoskeletal: Positive for joint swelling and arthralgias. Negative for back pain and gait problem.  Skin: Negative for color change and pallor.  Neurological: Negative for dizziness, tremors, seizures, syncope, speech difficulty, weakness, light-headedness and numbness.  Hematological: Negative for adenopathy. Does not bruise/bleed easily.  Psychiatric/Behavioral: Positive for sleep disturbance. Negative for confusion and agitation. The patient is nervous/anxious.        Objective:   Physical Exam   Filed Vitals:   12/02/11 0941  BP: 110/60  Pulse: 54  Temp: 97.8 F (36.6 C)  TempSrc: Oral  Height: 5\' 6"  (1.676 m)  Weight: 207 lb (93.895 kg)  SpO2: 98%    Gen: Pleasant, well-nourished, in no distress,  normal affect  ENT: No lesions,  mouth clear,  oropharynx clear, no postnasal drip  Neck: No JVD,  no TMG, no carotid bruits  Lungs: No use of accessory muscles, no dullness to percussion, improved airflow  Cardiovascular: RRR, heart sounds normal, no murmur or gallops, no peripheral edema  Abdomen: soft and NT, no HSM,  BS normal  Musculoskeletal: No deformities, no cyanosis or clubbing  Neuro: alert, non focal  Skin: Warm, no lesions or rashes         Assessment & Plan:   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 hydrOXYzine (ATARAX) 25 MG tablet Take 25 mg by mouth daily.        Marland Kitchen losartan (COZAAR) 25 MG tablet Take 25 mg by mouth daily.        . Melatonin 5 MG CAPS Take 10 mg by mouth at bedtime.       . meloxicam (MOBIC) 15 MG tablet Take 1 tablet by mouth as needed.      . metFORMIN (GLUCOPHAGE-XR) 500 MG 24 hr tablet Take 1 tablet (500 mg total) by mouth daily with breakfast.  90 tablet  3  . mometasone (ASMANEX) 220 MCG/INH inhaler Inhale 1 puff into the lungs daily.      Letta Pate DELICA LANCETS MISC Use as directed to check blood sugar once daily  100 each  3  . simvastatin (ZOCOR) 40 MG tablet Take 40 mg by mouth daily.        Marland Kitchen triamcinolone cream (KENALOG) 0.1 % Apply topically 3 (three) times daily. As needed for rash  45 g  1  . [DISCONTINUED] mometasone (ASMANEX 120 METERED DOSES) 220 MCG/INH inhaler Inhale 2 puffs into the lungs daily.  3 Inhaler  3  . budesonide-formoterol (SYMBICORT) 160-4.5 MCG/ACT inhaler Inhale 1 puff into the lungs 2 (two) times daily. Stop when current inhaler runs out  1 Inhaler

## 2011-12-02 NOTE — Assessment & Plan Note (Signed)
Cough variant asthma with atopic features stable at this time Plan Maintain Asmanex 1 inhalation daily The patient deferred a flu vaccine Return 6 months

## 2012-01-17 ENCOUNTER — Encounter: Payer: Self-pay | Admitting: Internal Medicine

## 2012-01-17 ENCOUNTER — Telehealth: Payer: Self-pay | Admitting: Internal Medicine

## 2012-01-17 ENCOUNTER — Telehealth: Payer: Self-pay | Admitting: Critical Care Medicine

## 2012-01-17 ENCOUNTER — Ambulatory Visit (INDEPENDENT_AMBULATORY_CARE_PROVIDER_SITE_OTHER): Payer: BC Managed Care – PPO | Admitting: Internal Medicine

## 2012-01-17 VITALS — BP 122/60 | HR 75 | Temp 98.6°F | Ht 66.0 in | Wt 210.6 lb

## 2012-01-17 DIAGNOSIS — J45909 Unspecified asthma, uncomplicated: Secondary | ICD-10-CM

## 2012-01-17 MED ORDER — METHYLPREDNISOLONE ACETATE 80 MG/ML IJ SUSP
80.0000 mg | Freq: Once | INTRAMUSCULAR | Status: AC
  Administered 2012-01-17: 80 mg via INTRAMUSCULAR

## 2012-01-17 MED ORDER — PROMETHAZINE-CODEINE 6.25-10 MG/5ML PO SYRP: 5.0000 mL | ORAL_SOLUTION | Freq: Four times a day (QID) | ORAL | Status: DC | PRN

## 2012-01-17 MED ORDER — DOXYCYCLINE HYCLATE 100 MG PO TABS: ORAL_TABLET | ORAL | Status: DC

## 2012-01-17 MED ORDER — PHENYLEPHRINE HCL 1 % NA SOLN
3.0000 [drp] | Freq: Once | NASAL | Status: AC
  Administered 2012-01-17: 3 [drp] via NASAL

## 2012-01-17 NOTE — Patient Instructions (Addendum)
Script for doxycycline antibiotic- finish the azithromycin/ zithromax one per day. You can start doxycycline on top of it  Neb neo nasal  Depo 80  Continue saline nasal rinse  Plenty of fluids, and mucinex-DM may help with cough

## 2012-01-17 NOTE — Telephone Encounter (Signed)
Pt has decided to turn around & pick up the Rx here.  Grace Cordova

## 2012-01-17 NOTE — Progress Notes (Signed)
Subjective:    Patient ID: Grace Cordova, female    DOB: 04-09-1940, 71 y.o.   MRN: [id number]  HPI  07/04/2011 Cough is now  Better.  No real mucus.  Notes a slight tickle in throat.  No dyspnea.  No wheezing. No heart burn.  Pt on symbicort twice daily until one week ago and changed to prn.  Pt will skip a day. Pt denies any significant sore throat, nasal congestion or excess secretions, fever, chills, sweats, unintended weight loss, pleurtic or exertional chest pain, orthopnea PND, or leg swelling Pt denies any increase in rescue therapy over baseline, denies waking up needing it or having any early am or nocturnal exacerbations of coughing/wheezing/or dyspnea. Pt also denies any obvious fluctuation in symptoms with  weather or environmental change or other alleviating or aggravating factors  12/02/2011 Cough continues to be better.  Pt on asmanex one puff daily.  No real dyspnea.  Now off PPI. Occ in AM will have a taste in the mouth.  Follows reflux diet +/-.  Drinks herbal tea.  No qhs dyspnea or cough. Pt denies any significant sore throat, nasal congestion or excess secretions, fever, chills, sweats, unintended weight loss, pleurtic or exertional chest pain, orthopnea PND, or leg swelling Pt denies any increase in rescue therapy over baseline, denies waking up needing it or having any early am or nocturnal exacerbations of coughing/wheezing/or dyspnea. Pt also denies any obvious fluctuation in symptoms with  weather or environmental change or other alleviating or aggravating factors  01/17/12- 71 yoF former smoker followed by Dr Delford Field cough variant asthma Acute visit- [facility]- head cold over the past 10-12 days then persistent cough. She took some leftover Zithromax starting December 24 and has 4/6 so far. Head congestion-stuffy without headache, much dry cough with relatively little clear mucous. Nothing purulent. May have felt a chill once but no fever. Denies swollen glands, fluid  retention, GI upset. She increased her Asmanex twice daily. CXR 5/23 /13 IMPRESSION:  No active cardiopulmonary disease.  Original Report Authenticated By: Cyndie Chime, M.D.    ROS-see HPI Constitutional:   No-   weight loss, night sweats, fevers, chills, fatigue, lassitude. HEENT:   No-  headaches, difficulty swallowing, tooth/dental problems, sore throat,       No-  sneezing, itching, ear ache, +nasal congestion, post nasal drip,  CV:  No-   chest pain, orthopnea, PND, swelling in lower extremities, anasarca, dizziness, palpitations Resp: No-   shortness of breath with exertion or at rest.              No-   productive cough,  + non-productive cough,  No- coughing up of blood.              No-   change in color of mucus.  No- wheezing.   Skin: No-   rash or lesions. GI:  No-   heartburn, indigestion, abdominal pain, nausea, vomiting,  GU: . MS:  No-   joint pain or swelling.   Neuro-     nothing unusual Psych:  No- change in mood or affect. No depression or anxiety.  No memory loss.  Objective:  OBJ- Physical Exam General- Alert, Oriented, Affect-appropriate, Distress- none acute Skin- rash-none, lesions- none, excoriation- none Lymphadenopathy- none Head- atraumatic            Eyes- Gross vision intact, PERRLA, conjunctivae and secretions clear            Ears- Hearing, canals-normal  Nose- + turbinate edema and nasal quality speech, no-Septal dev, mucus, polyps, erosion, perforation             Throat- Mallampati II , mucosa clear , drainage- none, tonsils- atrophic Neck- flexible , trachea midline, no stridor , thyroid + goiter, carotid no bruit Chest - symmetrical excursion , unlabored           Heart/CV- RRR , no murmur , no gallop  , no rub, nl s1 s2                           - JVD- none , edema- none, stasis changes- none, varices- none           Lung- clear to P&A, wheeze- none, + dry, hard cough , dullness-none, rub- none           Chest wall-  Abd-  Br/  Gen/ Rectal- Not done, not indicated Extrem- cyanosis- none, clubbing, none, atrophy- none, strength- nl Neuro- grossly intact to observation          Assessment & Plan:

## 2012-01-17 NOTE — Telephone Encounter (Signed)
Spoke with pt and she states has had terrible cough x 1 wk She is req ov for today I have scheduled her to see CDY for today at 2:45

## 2012-01-29 NOTE — Assessment & Plan Note (Signed)
Viral pattern upper respiratory infection with bronchitis, typical of what is in the community now. We discussed are limited ability to intervene, emphasizing supportive measures such as fluid, avoid getting chilled, avoid getting overly tired, Mucinex. Plan-nasal nebulizer decongestant, Depo-Medrol, doxycycline, Phenergan with codeine

## 2012-02-03 ENCOUNTER — Telehealth: Payer: Self-pay | Admitting: Critical Care Medicine

## 2012-02-03 NOTE — Telephone Encounter (Signed)
Look into a double book in [place] office slot

## 2012-02-03 NOTE — Telephone Encounter (Signed)
(  continued)  Pt prefers to only see PW.  Would like to discuss PW's recs if she is not able to get worked in w/ PW.  Grace Cordova

## 2012-02-03 NOTE — Telephone Encounter (Signed)
Spoke with pt and she states that she is still having a clear  Productive cough and no energy. Pt was seen in her PCP and Was offered an abx and prednisone but refused. Pt states she Doesn't want to have to take abx everytime she turns around.  Stated that pcp referred her to ent for acid reflux,but hasnt heard from them yet. Pt wants to see you asap to discuss this with you. Doesn't want to see anyone   else at this time. Dr Delford Field please advise Thank You .

## 2012-02-03 NOTE — Telephone Encounter (Signed)
Called, spoke with pt.  We have scheduled her to see Dr. Delford Field on Thursday, Feb 06, 2012 at 9am in [place].  Pt aware of appt date/time/location and voiced no further questions or concerns at this time.

## 2012-02-06 ENCOUNTER — Encounter: Payer: Self-pay | Admitting: Critical Care Medicine

## 2012-02-06 ENCOUNTER — Ambulatory Visit (INDEPENDENT_AMBULATORY_CARE_PROVIDER_SITE_OTHER): Payer: BC Managed Care – PPO | Admitting: Critical Care Medicine

## 2012-02-06 ENCOUNTER — Encounter: Payer: Self-pay | Admitting: Endocrinology

## 2012-02-06 ENCOUNTER — Ambulatory Visit (INDEPENDENT_AMBULATORY_CARE_PROVIDER_SITE_OTHER): Payer: BC Managed Care – PPO | Admitting: Endocrinology

## 2012-02-06 ENCOUNTER — Ambulatory Visit (INDEPENDENT_AMBULATORY_CARE_PROVIDER_SITE_OTHER): Payer: BC Managed Care – PPO | Admitting: Otolaryngology

## 2012-02-06 VITALS — BP 126/80 | HR 72 | Temp 98.6°F | Wt 209.0 lb

## 2012-02-06 VITALS — BP 140/74 | HR 78 | Temp 98.3°F | Ht 66.0 in | Wt 209.0 lb

## 2012-02-06 DIAGNOSIS — E042 Nontoxic multinodular goiter: Secondary | ICD-10-CM

## 2012-02-06 DIAGNOSIS — R490 Dysphonia: Secondary | ICD-10-CM

## 2012-02-06 DIAGNOSIS — E119 Type 2 diabetes mellitus without complications: Secondary | ICD-10-CM

## 2012-02-06 DIAGNOSIS — J45909 Unspecified asthma, uncomplicated: Secondary | ICD-10-CM

## 2012-02-06 DIAGNOSIS — K219 Gastro-esophageal reflux disease without esophagitis: Secondary | ICD-10-CM

## 2012-02-06 LAB — HEMOGLOBIN A1C: Mean Plasma Glucose: 137 mg/dL — ABNORMAL HIGH (ref <117)

## 2012-02-06 LAB — TSH: TSH: 1.784 u[IU]/mL (ref 0.350–4.500)

## 2012-02-06 MED ORDER — OMEPRAZOLE 20 MG PO CPDR: 20.0000 mg | DELAYED_RELEASE_CAPSULE | Freq: Every day | ORAL | Status: AC

## 2012-02-06 NOTE — Progress Notes (Signed)
Subjective:    Patient ID: Grace Cordova, female    DOB: [birth year], 72 y.o.   MRN: [id number]  HPI 07/04/2011 Cough is now  Better.  No real mucus.  Notes a slight tickle in throat.  No dyspnea.  No wheezing. No heart burn.  Pt on symbicort twice daily until one week ago and changed to prn.  Pt will skip a day. Pt denies any significant sore throat, nasal congestion or excess secretions, fever, chills, sweats, unintended weight loss, pleurtic or exertional chest pain, orthopnea PND, or leg swelling Pt denies any increase in rescue therapy over baseline, denies waking up needing it or having any early am or nocturnal exacerbations of coughing/wheezing/or dyspnea. Pt also denies any obvious fluctuation in symptoms with  weather or environmental change or other alleviating or aggravating factors  12/02/2011 Cough continues to be better.  Pt on asmanex one puff daily.  No real dyspnea.  Now off PPI. Occ in AM will have a taste in the mouth.  Follows reflux diet +/-.  Drinks herbal tea.  No qhs dyspnea or cough. Pt denies any significant sore throat, nasal congestion or excess secretions, fever, chills, sweats, unintended weight loss, pleurtic or exertional chest pain, orthopnea PND, or leg swelling Pt denies any increase in rescue therapy over baseline, denies waking up needing it or having any early am or nocturnal exacerbations of coughing/wheezing/or dyspnea. Pt also denies any obvious fluctuation in symptoms with  weather or environmental change or other alleviating or aggravating factors  01/17/12- 71 yoF former smoker followed by Dr Delford Field cough variant asthma Acute visit- CDY- head cold over the past 10-12 days then persistent cough. She took some leftover Zithromax starting December 24 and has 4/6 so far. Head congestion-stuffy without headache, much dry cough with relatively little clear mucous. Nothing purulent. May have felt a chill once but no fever. Denies swollen glands, fluid  retention, GI upset. She increased her Asmanex twice daily. CXR 5/23 /13 IMPRESSION:  No active cardiopulmonary disease.  Original Report Authenticated By: Cyndie Chime, M.D.   02/06/2012 Pt with flare of AB with viral URI Rx doxy/depo.  GERD is an issue.  Now: still coughs but not as much. Cough is productive less.  Mucus is white and clear.  Pt denies any wheezing.  Pt with dyspnea on exertion.  Pain noted in LLL area.  Notes some soreness in throat. If lay flat and if swallow will have to cough.  Not following reflux diet     Review of Systems 11 pt ros neg except as per hpi    Objective:   Physical Exam Filed Vitals:   02/06/12 0854  BP: 140/74  Pulse: 78  Temp: 98.3 F (36.8 C)  TempSrc: Oral  Height: 5\' 6"  (1.676 m)  Weight: 209 lb (94.802 kg)  SpO2: 98%    Gen: Pleasant, well-nourished, in no distress,  normal affect  ENT: No lesions,  mouth clear,  oropharynx clear, no postnasal drip  Neck: No JVD, no TMG, no carotid bruits  Lungs: No use of accessory muscles, no dullness to percussion, clear without rales or rhonchi  Cardiovascular: RRR, heart sounds normal, no murmur or gallops, no peripheral edema  Abdomen: soft and NT, no HSM,  BS normal  Musculoskeletal: No deformities, no cyanosis or clubbing  Neuro: alert, non focal  Skin: Warm, no lesions or rashes  No results found.        Assessment & Plan:   Cough variant asthma with  atopic features Asthmatic bronchitis with cyclical cough slowly improved Reflux likely playing a major role Plan Follow reflux diet Resume omeprazole 20mg  , take 1/2 hour before meal and then eat, AM is best Stay on asmanex two puff daily Use Delsym as needed for cough Keep ENT appointment Return 4 months    Updated Medication List Outpatient Encounter Prescriptions as of 02/06/2012  Medication Sig Dispense Refill  . hydrOXYzine (VISTARIL) 25 MG capsule Take 1 capsule by mouth daily.      Marland Kitchen losartan (COZAAR) 25  MG tablet Take 25 mg by mouth daily.        . Melatonin 5 MG CAPS Take 10 mg by mouth at bedtime.       . meloxicam (MOBIC) 15 MG tablet Take 1 tablet by mouth as needed.      . metFORMIN (GLUCOPHAGE-XR) 500 MG 24 hr tablet Take 1 tablet (500 mg total) by mouth daily with breakfast.  90 tablet  3  . mometasone (ASMANEX) 220 MCG/INH inhaler Inhale 2 puffs into the lungs daily.       Letta Pate DELICA LANCETS MISC Use as directed to check blood sugar once daily  100 each  3  . promethazine-codeine (PHENERGAN WITH CODEINE) 6.25-10 MG/5ML syrup Take 5 mLs by mouth every 6 (six) hours as needed for cough.  200 mL  0  . simvastatin (ZOCOR) 40 MG tablet Take 40 mg by mouth daily.        Marland Kitchen triamcinolone cream (KENALOG) 0.1 % Apply topically 3 (three) times daily. As needed for rash  45 g  1  . omeprazole (PRILOSEC) 20 MG capsule Take 1 capsule (20 mg total) by mouth daily.  30 capsule  4  . [DISCONTINUED] azithromycin (ZITHROMAX Z-PAK) 250 MG tablet Take 250 mg by mouth as directed.      . [DISCONTINUED] doxycycline (VIBRA-TABS) 100 MG tablet 2 today then one daily  8 tablet  0  . [DISCONTINUED] hydrOXYzine (ATARAX) 25 MG tablet Take 25 mg by mouth daily.

## 2012-02-06 NOTE — Patient Instructions (Addendum)
blood tests are being requested for you today.  We'll contact you with results. Let's recheck the thyroid ultrasound.  you will receive a phone call, about a day and time for an appointment Please return in 1 year.

## 2012-02-06 NOTE — Assessment & Plan Note (Signed)
Asthmatic bronchitis with cyclical cough slowly improved Reflux likely playing a major role Plan Follow reflux diet Resume omeprazole 20mg  , take 1/2 hour before meal and then eat, AM is best Stay on asmanex two puff daily Use Delsym as needed for cough Keep ENT appointment Return 4 months

## 2012-02-06 NOTE — Patient Instructions (Addendum)
Follow reflux diet Resume omeprazole 20mg  , take 1/2 hour before meal and then eat, AM is best Stay on asmanex two puff daily Use Delsym as needed for cough Keep ENT appointment Return 4 months

## 2012-02-06 NOTE — Progress Notes (Signed)
Subjective:    Patient ID: Grace Cordova, female    DOB: 12-Mar-1940, 72 y.o.   MRN: [id number]  HPI Pt returns for f/u of DM (dx'ed 1999; no known complications).  She tolerates metformin well. pt states she feels well in general, except for a resp illness--she has appt to see pulm today.  She tolerates metformin well.  Past Medical History  Diagnosis Date  . GOITER, MULTINODULAR 12/07/2009  . VITAMIN B12 DEFICIENCY 11/09/2009  . DM 11/09/2009  . ASTHMA 11/09/2009  . Hyperlipidemia   . Hypertension   . Anxiety   . Pernicious anemia     on monthly B12 injections by PCP  . Colon polyps 2009    Past Surgical History  Procedure Date  . Cesarean section   . Uterine fibroid surgery     x2    History   Social History  . Marital Status: Divorced    Spouse Name: N/A    Number of Children: 2  . Years of Education: N/A   Occupational History  . Adminstrative Assistant     works in Education officer, museum with Teacher, music, RNs   Social History Main Topics  . Smoking status: Former Smoker -- 1.0 packs/day for 25 years    Types: Cigarettes    Quit date: 01/21/1986  . Smokeless tobacco: Never Used  . Alcohol Use: Yes     Comment: wine with dinner on the weekends  . Drug Use: No  . Sexually Active: Not on file   Other Topics Concern  . Not on file   Social History Narrative   Daily Caffeine Use-1-2 drinks/day    Current Outpatient Prescriptions on File Prior to Visit  Medication Sig Dispense Refill  . losartan (COZAAR) 25 MG tablet Take 25 mg by mouth daily.        . Melatonin 5 MG CAPS Take 10 mg by mouth at bedtime.       . meloxicam (MOBIC) 15 MG tablet Take 1 tablet by mouth as needed.      . metFORMIN (GLUCOPHAGE-XR) 500 MG 24 hr tablet Take 1 tablet (500 mg total) by mouth daily with breakfast.  90 tablet  3  . mometasone (ASMANEX) 220 MCG/INH inhaler Inhale 2 puffs into the lungs daily.       Letta Pate DELICA LANCETS MISC Use as directed to check blood sugar once  daily  100 each  3  . promethazine-codeine (PHENERGAN WITH CODEINE) 6.25-10 MG/5ML syrup Take 5 mLs by mouth every 6 (six) hours as needed for cough.  200 mL  0  . simvastatin (ZOCOR) 40 MG tablet Take 40 mg by mouth daily.        Marland Kitchen triamcinolone cream (KENALOG) 0.1 % Apply topically 3 (three) times daily. As needed for rash  45 g  1  . hydrOXYzine (VISTARIL) 25 MG capsule Take 1 capsule by mouth daily.      Marland Kitchen omeprazole (PRILOSEC) 20 MG capsule Take 1 capsule (20 mg total) by mouth daily.  30 capsule  4    Allergies  Allergen Reactions  . Penicillins   . Sulfonamide Derivatives     Family History  Problem Relation Age of Onset  . Goiter Mother     had resection of benign goiter  . Diabetes Mother   . Heart disease Mother   . Diabetes Father   . Heart disease Father   . Cancer Sister     Half sister-Breast Cancer  . Cancer Brother 2  Half brother-Colon Cancer  . Diabetes Maternal Grandmother   . Diabetes Paternal Grandmother   . Cancer Cousin     Colon Cancer-dx age 55s  . Cancer Other     Nephew-Colon Cancer    BP 126/80  Pulse 72  Temp 98.6 F (37 C) (Oral)  Wt 209 lb (94.802 kg)  SpO2 98%     Review of Systems She has a few lbs of weight gain.    Objective:   Physical Exam Neck: 5x normal size thyroid, with multinodular surface. Pulses: dorsalis pedis intact bilat.   Feet: no deformity.  no ulcer on the feet.  feet are of normal color and temp.  no edema Neuro: sensation is intact to touch on the feet.  Lab Results  Component Value Date   HGBA1C 6.4* 02/06/2012      Assessment & Plan:  DM: well-controlled Multinodular goiter, clinically unchanged

## 2012-02-10 ENCOUNTER — Ambulatory Visit
Admission: RE | Admit: 2012-02-10 | Discharge: 2012-02-10 | Disposition: A | Discharge status: Home / Self Care | Payer: BC Managed Care – PPO | Source: Ambulatory Visit | Attending: Endocrinology | Admitting: Endocrinology

## 2012-03-05 ENCOUNTER — Ambulatory Visit (INDEPENDENT_AMBULATORY_CARE_PROVIDER_SITE_OTHER): Payer: BC Managed Care – PPO | Admitting: Otolaryngology

## 2012-03-19 ENCOUNTER — Ambulatory Visit (INDEPENDENT_AMBULATORY_CARE_PROVIDER_SITE_OTHER): Payer: BC Managed Care – PPO | Admitting: Otolaryngology

## 2012-03-19 DIAGNOSIS — R490 Dysphonia: Secondary | ICD-10-CM

## 2012-03-19 DIAGNOSIS — K219 Gastro-esophageal reflux disease without esophagitis: Secondary | ICD-10-CM

## 2012-05-15 ENCOUNTER — Ambulatory Visit (INDEPENDENT_AMBULATORY_CARE_PROVIDER_SITE_OTHER): Payer: BC Managed Care – PPO | Admitting: Ophthalmology

## 2012-05-20 ENCOUNTER — Other Ambulatory Visit: Payer: Self-pay | Admitting: Obstetrics and Gynecology

## 2012-06-18 ENCOUNTER — Encounter: Payer: Self-pay | Admitting: Endocrinology

## 2012-06-18 ENCOUNTER — Ambulatory Visit (INDEPENDENT_AMBULATORY_CARE_PROVIDER_SITE_OTHER): Payer: BC Managed Care – PPO | Admitting: Endocrinology

## 2012-06-18 VITALS — BP 128/76 | HR 80 | Ht 65.0 in | Wt 204.0 lb

## 2012-06-18 DIAGNOSIS — E042 Nontoxic multinodular goiter: Secondary | ICD-10-CM

## 2012-06-18 DIAGNOSIS — E119 Type 2 diabetes mellitus without complications: Secondary | ICD-10-CM

## 2012-06-18 LAB — HEMOGLOBIN A1C: Hgb A1c MFr Bld: 6.7 % — ABNORMAL HIGH (ref 4.6–6.5)

## 2012-06-18 NOTE — Patient Instructions (Addendum)
A diabetes blood test is requested for you today.  We'll contact you with results.   Please come back for a follow-up appointment in 6 months.   check your blood sugar once a day.  vary the time of day when you check, between before the 3 meals, and at bedtime.  also check if you have symptoms of your blood sugar being too high or too low.  please keep a record of the readings and bring it to your next appointment here.  please call us sooner if your blood sugar goes below 70, or if you have a lot of readings over 200.

## 2012-06-18 NOTE — Progress Notes (Signed)
Subjective:    Patient ID: Grace Cordova, female    DOB: 1940/02/14, 72 y.o.   MRN: [id number]  HPI Pt returns for f/u of type 2 DM (dx'ed 1999; she has mild if any neuropathy of the lower extremities; no associated complications).  She tolerates metformin well.  pt reports thirst and fatigue.  She has never checked cbg during an episode.   Past Medical History  Diagnosis Date  . GOITER, MULTINODULAR 12/07/2009  . VITAMIN B12 DEFICIENCY 11/09/2009  . DM 11/09/2009  . ASTHMA 11/09/2009  . Hyperlipidemia   . Hypertension   . Anxiety   . Pernicious anemia     on monthly B12 injections by PCP  . Colon polyps 2009    Past Surgical History  Procedure Laterality Date  . Cesarean section    . Uterine fibroid surgery      x2    History   Social History  . Marital Status: Divorced    Spouse Name: N/A    Number of Children: 2  . Years of Education: N/A   Occupational History  . Adminstrative Assistant     works in Education officer, museum with Teacher, music, RNs   Social History Main Topics  . Smoking status: Former Smoker -- 1.00 packs/day for 25 years    Types: Cigarettes    Quit date: 01/21/1986  . Smokeless tobacco: Never Used  . Alcohol Use: Yes     Comment: wine with dinner on the weekends  . Drug Use: No  . Sexually Active: Not on file   Other Topics Concern  . Not on file   Social History Narrative   Daily Caffeine Use-1-2 drinks/day    Current Outpatient Prescriptions on File Prior to Visit  Medication Sig Dispense Refill  . hydrOXYzine (VISTARIL) 25 MG capsule Take 1 capsule by mouth daily.      Marland Kitchen losartan (COZAAR) 25 MG tablet Take 25 mg by mouth daily.        . Melatonin 5 MG CAPS Take 10 mg by mouth at bedtime.       . meloxicam (MOBIC) 15 MG tablet Take 1 tablet by mouth as needed.      . metFORMIN (GLUCOPHAGE-XR) 500 MG 24 hr tablet Take 1 tablet (500 mg total) by mouth daily with breakfast.  90 tablet  3  . mometasone (ASMANEX) 220 MCG/INH inhaler Inhale 2  puffs into the lungs daily.       Marland Kitchen omeprazole (PRILOSEC) 20 MG capsule Take 1 capsule (20 mg total) by mouth daily.  30 capsule  4  . ONETOUCH DELICA LANCETS MISC Use as directed to check blood sugar once daily  100 each  3  . promethazine-codeine (PHENERGAN WITH CODEINE) 6.25-10 MG/5ML syrup Take 5 mLs by mouth every 6 (six) hours as needed for cough.  200 mL  0  . simvastatin (ZOCOR) 40 MG tablet Take 40 mg by mouth daily.         No current facility-administered medications on file prior to visit.    Allergies  Allergen Reactions  . Penicillins   . Sulfonamide Derivatives     Family History  Problem Relation Age of Onset  . Goiter Mother     had resection of benign goiter  . Diabetes Mother   . Heart disease Mother   . Diabetes Father   . Heart disease Father   . Cancer Sister     Half sister-Breast Cancer  . Cancer Brother 69    Half  brother-Colon Cancer  . Diabetes Maternal Grandmother   . Diabetes Paternal Grandmother   . Cancer Cousin     Colon Cancer-dx age 73s  . Cancer Other     Nephew-Colon Cancer    BP 128/76  Pulse 80  Ht 5\' 5"  (1.651 m)  Wt 204 lb (92.534 kg)  BMI 33.95 kg/m2  SpO2 98%   Review of Systems She denies weight change    Objective:   Physical Exam VITAL SIGNS:  See vs page GENERAL: no distress.    Lab Results  Component Value Date   HGBA1C 6.7* 06/18/2012      Assessment & Plan:  DM: well-controlled Fatigue and other sxs, uncertain etiology

## 2012-06-19 ENCOUNTER — Encounter: Payer: Self-pay | Admitting: Critical Care Medicine

## 2012-06-19 ENCOUNTER — Ambulatory Visit (INDEPENDENT_AMBULATORY_CARE_PROVIDER_SITE_OTHER): Payer: BC Managed Care – PPO | Admitting: Critical Care Medicine

## 2012-06-19 VITALS — BP 124/62 | HR 69 | Temp 98.4°F | Ht 65.5 in | Wt 207.0 lb

## 2012-06-19 DIAGNOSIS — J45909 Unspecified asthma, uncomplicated: Secondary | ICD-10-CM

## 2012-06-19 DIAGNOSIS — G4710 Hypersomnia, unspecified: Secondary | ICD-10-CM

## 2012-06-19 DIAGNOSIS — G4733 Obstructive sleep apnea (adult) (pediatric): Secondary | ICD-10-CM

## 2012-06-19 MED ORDER — ALBUTEROL SULFATE HFA 108 (90 BASE) MCG/ACT IN AERS: 2.0000 | INHALATION_SPRAY | Freq: Four times a day (QID) | RESPIRATORY_TRACT | Status: AC | PRN

## 2012-06-19 NOTE — Patient Instructions (Addendum)
REscue inhaler Proair sent to pharmacy Stay on Asmanex daily A sleep study will be obtained Return after sleep study completed

## 2012-06-19 NOTE — Progress Notes (Signed)
  Subjective:    Patient ID: Grace Cordova, female    DOB: 1940/06/21, 72 y.o.   MRN: [id number]  HPI  06/19/2012 ? If change in breathing or more fatigued Pt more weakness.  Pt felt like point of falling out Goes to be 1030pm , wakes up twice for urination. Wakes up tired.  No osa eval before.  Pt saw PCP and did ECG and eval with cardiology  Occ nods off while watching TV and nods off from work at 5pm  No cough now.  Pt with dyspnea exertion.   Epworth scale 6     Review of Systems  11 pt ros neg except as per hpi    Objective:   Physical Exam  Filed Vitals:   06/19/12 1537  BP: 124/62  Pulse: 69  Temp: 98.4 F (36.9 C)  TempSrc: Oral  Height: 5' 5.5" (1.664 m)  Weight: 207 lb (93.895 kg)  SpO2: 97%    Gen: Pleasant, well-nourished, in no distress,  normal affect  ENT: No lesions,  mouth clear,  oropharynx clear, no postnasal drip  Neck: No JVD, no TMG, no carotid bruits  Lungs: No use of accessory muscles, no dullness to percussion, clear without rales or rhonchi  Cardiovascular: RRR, heart sounds normal, no murmur or gallops, no peripheral edema  Abdomen: soft and NT, no HSM,  BS normal  Musculoskeletal: No deformities, no cyanosis or clubbing  Neuro: alert, non focal  Skin: Warm, no lesions or rashes  No results found.   5/30 spiro: normal      Assessment & Plan:   Hypersomnia Hypersomnia ? OSA, high epworth scale score Plan Sleep study  Cough variant asthma with atopic features Cough variant asthma stable with normal spiro 06/19/12  Plan Cont asmanex daily    Updated Medication List Outpatient Encounter Prescriptions as of 06/19/2012  Medication Sig Dispense Refill  . hydrOXYzine (VISTARIL) 25 MG capsule Take 1 capsule by mouth daily.      Marland Kitchen losartan (COZAAR) 25 MG tablet Take 25 mg by mouth daily.        . Melatonin 5 MG CAPS Take 10 mg by mouth at bedtime.       . meloxicam (MOBIC) 15 MG tablet Take 1 tablet by mouth as needed.       . metFORMIN (GLUCOPHAGE-XR) 500 MG 24 hr tablet Take 1 tablet (500 mg total) by mouth daily with breakfast.  90 tablet  3  . mometasone (ASMANEX) 220 MCG/INH inhaler Inhale 2 puffs into the lungs daily.       Marland Kitchen omeprazole (PRILOSEC) 20 MG capsule Take 1 capsule (20 mg total) by mouth daily.  30 capsule  4  . ONETOUCH DELICA LANCETS MISC Use as directed to check blood sugar once daily  100 each  3  . simvastatin (ZOCOR) 40 MG tablet Take 40 mg by mouth daily.        Marland Kitchen albuterol (PROAIR HFA) 108 (90 BASE) MCG/ACT inhaler Inhale 2 puffs into the lungs every 6 (six) hours as needed for wheezing.  1 Inhaler  3  . [DISCONTINUED] promethazine-codeine (PHENERGAN WITH CODEINE) 6.25-10 MG/5ML syrup Take 5 mLs by mouth every 6 (six) hours as needed for cough.  200 mL  0   No facility-administered encounter medications on file as of 06/19/2012.

## 2012-06-20 DIAGNOSIS — G4710 Hypersomnia, unspecified: Secondary | ICD-10-CM | POA: Insufficient documentation

## 2012-06-20 NOTE — Assessment & Plan Note (Signed)
Hypersomnia ? OSA, high epworth scale score Plan Sleep study

## 2012-06-20 NOTE — Assessment & Plan Note (Signed)
Cough variant asthma stable with normal spiro 06/19/12  Plan Cont asmanex daily

## 2012-07-17 ENCOUNTER — Encounter: Payer: Self-pay | Admitting: *Deleted

## 2012-07-20 ENCOUNTER — Encounter: Payer: Self-pay | Admitting: Internal Medicine

## 2012-07-20 ENCOUNTER — Ambulatory Visit (INDEPENDENT_AMBULATORY_CARE_PROVIDER_SITE_OTHER): Payer: BC Managed Care – PPO | Admitting: Internal Medicine

## 2012-07-20 VITALS — BP 140/78 | HR 67 | Ht 65.0 in | Wt 200.8 lb

## 2012-07-20 DIAGNOSIS — I10 Essential (primary) hypertension: Secondary | ICD-10-CM

## 2012-07-20 DIAGNOSIS — R0602 Shortness of breath: Secondary | ICD-10-CM

## 2012-07-20 LAB — CBC
Hemoglobin: 9.6 g/dL — ABNORMAL LOW (ref 12.0–15.0)
MCH: 27.1 pg (ref 26.0–34.0)
MCHC: 33.6 g/dL (ref 30.0–36.0)
MCV: 80.8 fL (ref 78.0–100.0)
RBC: 3.54 MIL/uL — ABNORMAL LOW (ref 3.87–5.11)

## 2012-07-20 LAB — BASIC METABOLIC PANEL
BUN: 18 mg/dL (ref 6–23)
CO2: 20 mEq/L (ref 19–32)
Calcium: 9.6 mg/dL (ref 8.4–10.5)
Creat: 1.75 mg/dL — ABNORMAL HIGH (ref 0.50–1.10)
Glucose, Bld: 109 mg/dL — ABNORMAL HIGH (ref 70–99)

## 2012-07-20 LAB — TSH: TSH: 1.171 u[IU]/mL (ref 0.350–4.500)

## 2012-07-20 NOTE — Progress Notes (Addendum)
HPI Patient is a 72 yo who is followed by [facility]  Seen by Dr Burns Spain here for presyncope Feels like going to fall.  Broke out into a sweat  Did fall on May 28th   On 28th was back from work.  Had been ok all day  taking trash out.    Got light headed and fell down steps  Did not pass out.  Felt shaky after  Sat down for awhile then felt OK   Denies definite palpitaitons  Weekend before 28th had been working in yard.  Tired.  Broke out into a sweat.  Dizzy  Sat down  Got back together  Got in shower  Same thing.  Not right that weekend.  Next episode was this last episode was Friday, 3 day ago.  Watering plants.  Felt lightheaded  Sat down  Got in shower  Very lightheaded  Drinks water, coffee  Doesn't push fluids.   No recnet illness. Does note some SOB with activity  No CP  Followed by Dr Everardo All as well  Denies numbness in feet  L arm occasionally numb History of DM, HTN, HL   Allergies  Allergen Reactions  . Penicillins   . Sulfonamide Derivatives     Current Outpatient Prescriptions  Medication Sig Dispense Refill  . hydrOXYzine (VISTARIL) 25 MG capsule Take 1 capsule by mouth daily.      Marland Kitchen losartan (COZAAR) 25 MG tablet Take 25 mg by mouth daily.        . Melatonin 5 MG CAPS Take 10 mg by mouth at bedtime.       . meloxicam (MOBIC) 15 MG tablet Take 1 tablet by mouth as needed.      . metFORMIN (GLUCOPHAGE-XR) 500 MG 24 hr tablet Take 1 tablet (500 mg total) by mouth daily with breakfast.  90 tablet  3  . mometasone (ASMANEX) 220 MCG/INH inhaler Inhale 2 puffs into the lungs daily.       Marland Kitchen omeprazole (PRILOSEC) 20 MG capsule Take 1 capsule (20 mg total) by mouth daily.  30 capsule  4  . ONETOUCH DELICA LANCETS MISC Use as directed to check blood sugar once daily  100 each  3  . simvastatin (ZOCOR) 40 MG tablet Take 40 mg by mouth daily.        Marland Kitchen albuterol (PROAIR HFA) 108 (90 BASE) MCG/ACT inhaler Inhale 2 puffs into the lungs every 6 (six) hours as needed for  wheezing.  1 Inhaler  3   No current facility-administered medications for this visit.    Past Medical History  Diagnosis Date  . GOITER, MULTINODULAR 12/07/2009  . VITAMIN B12 DEFICIENCY 11/09/2009  . DM 11/09/2009  . ASTHMA 11/09/2009  . Hyperlipidemia   . Hypertension   . Anxiety   . Pernicious anemia     on monthly B12 injections by PCP  . Colon polyps 2009    Past Surgical History  Procedure Laterality Date  . Cesarean section    . Uterine fibroid surgery      x2    Family History  Problem Relation Age of Onset  . Goiter Mother     had resection of benign goiter  . Diabetes Mother   . Heart disease Mother   . Diabetes Father   . Heart disease Father   . Cancer Sister     Half sister-Breast Cancer  . Cancer Brother 57    Half brother-Colon Cancer  . Diabetes Maternal Grandmother   .  Diabetes Paternal Grandmother   . Cancer Cousin     Colon Cancer-dx age 2s  . Cancer Other     Nephew-Colon Cancer    History   Social History  . Marital Status: Divorced    Spouse Name: N/A    Number of Children: 2  . Years of Education: N/A   Occupational History  . Adminstrative Assistant     works in Education officer, museum with Teacher, music, RNs   Social History Main Topics  . Smoking status: Former Smoker -- 1.00 packs/day for 25 years    Types: Cigarettes    Quit date: 01/21/1986  . Smokeless tobacco: Never Used  . Alcohol Use: Yes     Comment: wine with dinner on the weekends  . Drug Use: No  . Sexually Active: Not on file   Other Topics Concern  . Not on file   Social History Narrative   Daily Caffeine Use-1-2 drinks/day    Review of Systems:  All systems reviewed.  They are negative to the above problem except as previously stated.  Vital Signs: BP 113/66  Pulse 64  Ht 5\' 5"  (1.651 m)  Wt 200 lb 12 oz (91.06 kg)  BMI 33.41 kg/m2  Physical Exam Patient is in NAD HEENT:  Normocephalic, atraumatic. EOMI, PERRLA.  Neck: JVP is normal.  No bruits.   Lungs: clear to auscultation. No rales no wheezes.  Heart: Regular rate and rhythm. Normal S1, S2. No S3.   No significant murmurs. PMI not displaced.  Abdomen:  Supple, nontender. Normal bowel sounds. No masses. No hepatomegaly.  Extremities:   Good distal pulses throughout. No lower extremity edema.  Musculoskeletal :moving all extremities.  Neuro:   alert and oriented x3.  CN II-XII grossly intact.  EKG  SR 65  LVH Assessment and Plan:  1.  Presyncope.  Patient is not orthostatic on exam but history is suspicious for this.  She is symptomatic When showers gets symtpomatic.    Admits to not pushing fluids With SOB noted I would recomm an echo to eval LV/RV function I would also set up for labs (UA, CBC, BMET) Encouraged her to increase fluid/salt intake  Be careful for change in position. Follow up in 4 to 6 wks.

## 2012-07-20 NOTE — Patient Instructions (Addendum)
Your physician recommends that you schedule a follow-up appointment in: 4-6 weeks with PR  Your physician has requested that you have an echocardiogram. Echocardiography is a painless test that uses sound waves to create images of your heart. It provides your doctor with information about the size and shape of your heart and how well your heart's chambers and valves are working. This procedure takes approximately one hour. There are no restrictions for this procedure.  Your physician recommends that you return for lab work in: TODAY (SLIPS GIVEN FOR CBC,TSH,BMET, UA)

## 2012-07-21 ENCOUNTER — Ambulatory Visit (HOSPITAL_COMMUNITY)
Admission: RE | Admit: 2012-07-21 | Discharge: 2012-07-21 | Disposition: A | Discharge status: Home / Self Care | Payer: BC Managed Care – PPO | Source: Ambulatory Visit | Attending: Internal Medicine | Admitting: Internal Medicine

## 2012-07-21 DIAGNOSIS — I517 Cardiomegaly: Secondary | ICD-10-CM

## 2012-07-21 DIAGNOSIS — R0609 Other forms of dyspnea: Secondary | ICD-10-CM | POA: Insufficient documentation

## 2012-07-21 DIAGNOSIS — E119 Type 2 diabetes mellitus without complications: Secondary | ICD-10-CM | POA: Insufficient documentation

## 2012-07-21 DIAGNOSIS — R0602 Shortness of breath: Secondary | ICD-10-CM

## 2012-07-21 DIAGNOSIS — I10 Essential (primary) hypertension: Secondary | ICD-10-CM | POA: Insufficient documentation

## 2012-07-21 DIAGNOSIS — R55 Syncope and collapse: Secondary | ICD-10-CM | POA: Insufficient documentation

## 2012-07-21 DIAGNOSIS — R0989 Other specified symptoms and signs involving the circulatory and respiratory systems: Secondary | ICD-10-CM | POA: Insufficient documentation

## 2012-07-21 LAB — URINALYSIS
Bilirubin Urine: NEGATIVE
Hgb urine dipstick: NEGATIVE
Ketones, ur: NEGATIVE mg/dL
Nitrite: NEGATIVE
Protein, ur: NEGATIVE mg/dL
Urobilinogen, UA: 0.2 mg/dL (ref 0.0–1.0)

## 2012-07-21 NOTE — Progress Notes (Signed)
*  PRELIMINARY RESULTS* Echocardiogram 2D Echocardiogram has been performed.  Conrad Martelle 07/21/2012, 9:36 AM

## 2012-07-29 ENCOUNTER — Other Ambulatory Visit (HOSPITAL_COMMUNITY): Payer: BC Managed Care – PPO

## 2012-07-31 ENCOUNTER — Encounter (HOSPITAL_BASED_OUTPATIENT_CLINIC_OR_DEPARTMENT_OTHER): Payer: BC Managed Care – PPO

## 2012-08-23 ENCOUNTER — Other Ambulatory Visit: Payer: Self-pay | Admitting: Critical Care Medicine

## 2012-08-24 ENCOUNTER — Ambulatory Visit: Payer: BC Managed Care – PPO | Admitting: Internal Medicine

## 2012-08-31 ENCOUNTER — Encounter: Payer: Self-pay | Admitting: Pulmonary Disease

## 2012-08-31 ENCOUNTER — Ambulatory Visit (INDEPENDENT_AMBULATORY_CARE_PROVIDER_SITE_OTHER): Payer: BC Managed Care – PPO | Admitting: Pulmonary Disease

## 2012-08-31 VITALS — BP 104/62 | HR 72 | Temp 96.7°F | Ht 65.0 in | Wt 204.6 lb

## 2012-08-31 DIAGNOSIS — J45909 Unspecified asthma, uncomplicated: Secondary | ICD-10-CM

## 2012-08-31 NOTE — Assessment & Plan Note (Signed)
The patient has increasing cough over the last [redacted] weeks along with worsening fatigue associated with simple exertional activities.  She is unsure if she has increased dyspnea on exertion or not.  She has a history of reflux disease, but does not feel that it has worsened.  She denies significant postnasal drip.  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  She is to let us know if they do not.

## 2012-08-31 NOTE — Patient Instructions (Addendum)
Will treat with an 8 day course of prednisone to see if your symptoms improve.  If they do not, please let us know.  Keep f/u apptm with Dr. Delford Field.

## 2012-08-31 NOTE — Progress Notes (Signed)
  Subjective:    Patient ID: Grace Cordova, female    DOB: 04/23/1940, 72 y.o.   MRN: [id number]  HPI The patient comes in today for an acute sick visit.  She has known cough variant asthma, and has been well-controlled in the past with Asmanex.  She gives a three-week history of progressive cough, but no significant quantity of mucus or purulence.  She is unsure if she has dyspnea on exertion, but feels "fatigued" after doing simple exertional activities.  She denies any fevers, chills, or sweats.  She is not having any increased postnasal drip or reflux symptoms.   Review of Systems  Constitutional: Negative for fever and unexpected weight change.  HENT: Positive for ear pain, congestion, sneezing and postnasal drip. Negative for nosebleeds, sore throat, rhinorrhea, trouble swallowing, dental problem and sinus pressure.   Eyes: Negative for redness and itching.  Respiratory: Positive for cough, shortness of breath and wheezing. Negative for chest tightness.   Cardiovascular: Negative for palpitations and leg swelling.  Gastrointestinal: Negative for nausea and vomiting.  Genitourinary: Negative for dysuria.  Musculoskeletal: Negative for joint swelling.  Skin: Negative for rash.  Neurological: Negative for headaches.  Hematological: Does not bruise/bleed easily.  Psychiatric/Behavioral: Negative for dysphoric mood. The patient is not nervous/anxious.        Objective:   Physical Exam Overweight female in no acute distress Nose without purulence or discharge noted Neck without lymphadenopathy or thyromegaly Chest totally clear to auscultation, no wheezing Cardiac exam with regular rate and rhythm Lower extremities without edema, no cyanosis Alert and oriented, moves all 4 extremities.       Assessment & Plan:

## 2012-09-01 ENCOUNTER — Telehealth: Payer: Self-pay | Admitting: Pulmonary Disease

## 2012-09-01 MED ORDER — PREDNISONE 10 MG PO TABS: ORAL_TABLET | ORAL | Status: DC

## 2012-09-01 NOTE — Telephone Encounter (Signed)
KC wanted pt to start 8 day course of prednisone. This was not called in. I advised pt will do so. She needed nothing further

## 2012-09-05 ENCOUNTER — Encounter (HOSPITAL_COMMUNITY): Payer: Self-pay

## 2012-09-19 ENCOUNTER — Other Ambulatory Visit: Payer: Self-pay | Admitting: Critical Care Medicine

## 2012-09-25 ENCOUNTER — Encounter: Payer: Self-pay | Admitting: Internal Medicine

## 2012-09-25 ENCOUNTER — Encounter: Payer: Self-pay | Admitting: *Deleted

## 2012-09-25 ENCOUNTER — Ambulatory Visit (INDEPENDENT_AMBULATORY_CARE_PROVIDER_SITE_OTHER): Payer: BC Managed Care – PPO | Admitting: Internal Medicine

## 2012-09-25 VITALS — BP 140/69 | HR 81 | Ht 65.5 in | Wt 201.0 lb

## 2012-09-25 DIAGNOSIS — R0602 Shortness of breath: Secondary | ICD-10-CM

## 2012-09-25 DIAGNOSIS — R42 Dizziness and giddiness: Secondary | ICD-10-CM

## 2012-09-25 NOTE — Progress Notes (Signed)
HPI pateient doing trip had to rest. One week later had same experience with same task. 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 Upper back pain when cooking but no chest pain.    None since.  Breathing OK ' Allergies  Allergen Reactions  . Penicillins   . Sulfonamide Derivatives     Current Outpatient Prescriptions  Medication Sig Dispense Refill  . albuterol (PROAIR HFA) 108 (90 BASE) MCG/ACT inhaler Inhale 2 puffs into the lungs every 6 (six) hours as needed for wheezing.  1 Inhaler  3  . ASMANEX 120 METERED DOSES 220 MCG/INH inhaler USE 2 INHALATIONS ORALLY   INTO THE LUNGS DAILY  3 Inhaler  1  . hydrOXYzine (VISTARIL) 25 MG capsule Take 1 capsule by mouth daily.      Marland Kitchen losartan (COZAAR) 25 MG tablet Take 25 mg by mouth daily.        . Melatonin 5 MG CAPS Take 10 mg by mouth at bedtime.       . metFORMIN (GLUCOPHAGE-XR) 500 MG 24 hr tablet Take 1 tablet (500 mg total) by mouth daily with breakfast.  90 tablet  3  . omeprazole (PRILOSEC) 20 MG capsule Take 1 capsule (20 mg total) by mouth daily.  30 capsule  4  . ONETOUCH DELICA LANCETS MISC Use as directed to check blood sugar once daily  100 each  3  . simvastatin (ZOCOR) 40 MG tablet Take 40 mg by mouth daily.        . meloxicam (MOBIC) 15 MG tablet Take 1 tablet by mouth as needed.       No current facility-administered medications for this visit.    Past Medical History  Diagnosis Date  . GOITER, MULTINODULAR 12/07/2009  . VITAMIN B12 DEFICIENCY 11/09/2009  . DM 11/09/2009  . ASTHMA 11/09/2009  . Hyperlipidemia   . Hypertension   . Anxiety   . Pernicious anemia     on monthly B12 injections by PCP  . Colon polyps 2009    Past Surgical History  Procedure Laterality Date  . Cesarean section    . Uterine fibroid surgery      x2    Family History  Problem Relation Age of Onset  . Goiter Mother    had resection of benign goiter  . Diabetes Mother   . Heart disease Mother   . Diabetes Father   . Heart disease Father   . Cancer Sister     Half sister-Breast Cancer  . Cancer Brother 37    Half brother-Colon Cancer  . Diabetes Maternal Grandmother   . Diabetes Paternal Grandmother   . Cancer Cousin     Colon Cancer-dx age 39s  . Cancer Other     Nephew-Colon Cancer    History   Social History  . Marital Status: Divorced    Spouse Name: N/A    Number of Children: 2  . Years of Education: N/A   Occupational History  . Adminstrative Assistant     works in Education officer, museum with Teacher, music, RNs   Social History Main Topics  . Smoking status: Former Smoker -- 1.00 packs/day for 25 years    Types: Cigarettes    Quit date: 01/21/1986  . Smokeless tobacco: Never Used  . Alcohol Use: Yes     Comment: wine with dinner on the weekends  . Drug Use: No  .  Sexual Activity: Not on file   Other Topics Concern  . Not on file   Social History Narrative   Daily Caffeine Use-1-2 drinks/day    Review of Systems:  All systems reviewed.  They are negative to the above problem except as previously stated.  Vital Signs: BP 140/69  Pulse 81  Ht 5' 5.5" (1.664 m)  Wt 201 lb (91.173 kg)  BMI 32.93 kg/m2  Physical Exam Patient is in NAD HEENT:  Normocephalic, atraumatic. EOMI, PERRLA.  Neck: JVP is normal.  No bruits.  Lungs: clear to auscultation. No rales no wheezes.  Heart: Regular rate and rhythm. Normal S1, S2. No S3.   No significant murmurs. PMI not displaced.  Abdomen:  Supple, nontender. Normal bowel sounds. No masses. No hepatomegaly.  Extremities:   Good distal pulses throughout. No lower extremity edema.  Musculoskeletal :moving all extremities.  Neuro:   alert and oriented x3.  CN II-XII grossly intact.   Assessment and Plan: 1.  Weakness  I am not sure what above symptoms represent.  ? Anginal equivalent. Set up for stress myoview.  Continue activity as  tolerated

## 2012-09-25 NOTE — Patient Instructions (Addendum)
Your physician recommends that you schedule a follow-up appointment in: TO BE DETERMINED  Your physician has requested that you have en exercise stress myoview. For further information please visit [email or web address] Please follow instruction sheet, as given.WE WILL CALL YOU WITH THE RESULTS/NEXT STEPS

## 2012-10-02 ENCOUNTER — Other Ambulatory Visit: Payer: Self-pay | Admitting: *Deleted

## 2012-10-02 MED ORDER — GLUCOSE BLOOD VI STRP: ORAL_STRIP | Status: DC

## 2012-10-02 MED ORDER — ONETOUCH ULTRASOFT LANCETS MISC: Status: DC

## 2012-10-06 ENCOUNTER — Encounter (HOSPITAL_COMMUNITY)
Admission: RE | Admit: 2012-10-06 | Discharge: 2012-10-06 | Disposition: A | Discharge status: Home / Self Care | Payer: BC Managed Care – PPO | Source: Ambulatory Visit | Attending: Internal Medicine | Admitting: Internal Medicine

## 2012-10-06 ENCOUNTER — Encounter (HOSPITAL_COMMUNITY): Payer: Self-pay

## 2012-10-06 DIAGNOSIS — R0602 Shortness of breath: Secondary | ICD-10-CM

## 2012-10-06 DIAGNOSIS — R42 Dizziness and giddiness: Secondary | ICD-10-CM | POA: Insufficient documentation

## 2012-10-06 MED ORDER — REGADENOSON 0.4 MG/5ML IV SOLN
INTRAVENOUS | Status: AC
  Filled 2012-10-06: qty 5

## 2012-10-06 MED ORDER — SODIUM CHLORIDE 0.9 % IJ SOLN
INTRAMUSCULAR | Status: AC
  Administered 2012-10-06: 10 mL via INTRAVENOUS
  Filled 2012-10-06: qty 10

## 2012-10-06 MED ORDER — TECHNETIUM TC 99M SESTAMIBI - CARDIOLITE
30.0000 | Freq: Once | INTRAVENOUS | Status: AC | PRN
  Administered 2012-10-06: 12:00:00 30 via INTRAVENOUS

## 2012-10-06 MED ORDER — TECHNETIUM TC 99M SESTAMIBI - CARDIOLITE
10.0000 | Freq: Once | INTRAVENOUS | Status: AC | PRN
  Administered 2012-10-06: 09:00:00 10.5 via INTRAVENOUS

## 2012-10-06 NOTE — Progress Notes (Signed)
Stress Lab Nurses Notes - [name]  Grace Cordova 10/06/2012 Reason for doing test: Dyspnea Type of test: Stress Cardiolite Nurse performing test: Parke Poisson, RN Nuclear Medicine Tech: Lou Cal Echo Tech: Not Applicable MD performing test: Ival Bible Family MD: Dr. Sherwood Gambler Test explained and consent signed: yes IV started: 22g jelco, Saline lock flushed, No redness or edema and Saline lock started in radiology Symptoms: SOB Treatment/Intervention: None Reason test stopped: reached target HR After recovery IV was: Discontinued via X-ray tech and No redness or edema Patient to return to Nuc. Med at : 12:15 Patient discharged: Home Patient's Condition upon discharge was: stable Comments: During test BP 193/66 & HR 148.  Recovery BP 143/57 & HR 86.  Symptoms resolved in recovery. [name] T

## 2012-10-08 ENCOUNTER — Other Ambulatory Visit: Payer: Self-pay

## 2012-10-08 MED ORDER — METFORMIN HCL ER 500 MG PO TB24: 500.0000 mg | ORAL_TABLET | Freq: Every day | ORAL | Status: DC

## 2012-10-09 ENCOUNTER — Other Ambulatory Visit: Payer: Self-pay | Admitting: *Deleted

## 2012-10-09 MED ORDER — METFORMIN HCL ER 500 MG PO TB24: 500.0000 mg | ORAL_TABLET | Freq: Every day | ORAL | Status: AC

## 2012-10-12 ENCOUNTER — Telehealth: Payer: Self-pay | Admitting: Endocrinology

## 2012-10-12 NOTE — Telephone Encounter (Signed)
Spoke with pharmacist rx sent to pharmacy

## 2012-10-12 NOTE — Telephone Encounter (Signed)
Please call [facility] pharmacy reqarding script - metformin. Sent requesdt on Thursday, have not gotten a response back. CB# [phone]/ Sherri S.

## 2012-10-13 ENCOUNTER — Encounter: Payer: Self-pay | Admitting: Internal Medicine

## 2012-10-14 ENCOUNTER — Other Ambulatory Visit: Payer: Self-pay | Admitting: *Deleted

## 2012-10-19 NOTE — Progress Notes (Signed)
Need orders in EPIC.  Surgery scheduled for 10/26/12.  preop appointment on 10/20/12 at 1100am.  Thank You.

## 2012-10-20 ENCOUNTER — Encounter (HOSPITAL_COMMUNITY)
Admission: RE | Admit: 2012-10-20 | Discharge: 2012-10-20 | Disposition: A | Discharge status: Home / Self Care | Payer: BC Managed Care – PPO | Source: Ambulatory Visit | Attending: Orthopedic Surgery | Admitting: Orthopedic Surgery

## 2012-10-20 ENCOUNTER — Other Ambulatory Visit: Payer: Self-pay | Admitting: Orthopedic Surgery

## 2012-10-20 ENCOUNTER — Encounter (HOSPITAL_COMMUNITY): Payer: Self-pay

## 2012-10-20 ENCOUNTER — Encounter (HOSPITAL_COMMUNITY): Payer: Self-pay | Admitting: Pharmacy Technician

## 2012-10-20 ENCOUNTER — Ambulatory Visit (HOSPITAL_COMMUNITY)
Admission: RE | Admit: 2012-10-20 | Discharge: 2012-10-20 | Disposition: A | Discharge status: Home / Self Care | Payer: BC Managed Care – PPO | Source: Ambulatory Visit | Attending: Orthopedic Surgery | Admitting: Orthopedic Surgery

## 2012-10-20 DIAGNOSIS — Z01818 Encounter for other preprocedural examination: Secondary | ICD-10-CM | POA: Insufficient documentation

## 2012-10-20 DIAGNOSIS — E042 Nontoxic multinodular goiter: Secondary | ICD-10-CM | POA: Insufficient documentation

## 2012-10-20 DIAGNOSIS — Z01812 Encounter for preprocedural laboratory examination: Secondary | ICD-10-CM | POA: Insufficient documentation

## 2012-10-20 DIAGNOSIS — M1710 Unilateral primary osteoarthritis, unspecified knee: Secondary | ICD-10-CM | POA: Insufficient documentation

## 2012-10-20 DIAGNOSIS — J398 Other specified diseases of upper respiratory tract: Secondary | ICD-10-CM | POA: Insufficient documentation

## 2012-10-20 HISTORY — DX: Unspecified osteoarthritis, unspecified site: M19.90

## 2012-10-20 HISTORY — DX: Gastro-esophageal reflux disease without esophagitis: K21.9

## 2012-10-20 LAB — COMPREHENSIVE METABOLIC PANEL
ALT: 8 U/L (ref 0–35)
Calcium: 10.1 mg/dL (ref 8.4–10.5)
Creatinine, Ser: 1.34 mg/dL — ABNORMAL HIGH (ref 0.50–1.10)
GFR calc Af Amer: 45 mL/min — ABNORMAL LOW (ref >90)
GFR calc non Af Amer: 39 mL/min — ABNORMAL LOW (ref >90)
Glucose, Bld: 92 mg/dL (ref 70–99)
Sodium: 139 mEq/L (ref 135–145)
Total Protein: 7.5 g/dL (ref 6.0–8.3)

## 2012-10-20 LAB — CBC
Hemoglobin: 9.4 g/dL — ABNORMAL LOW (ref 12.0–15.0)
MCH: 28.1 pg (ref 26.0–34.0)
MCHC: 32.3 g/dL (ref 30.0–36.0)
MCV: 86.9 fL (ref 78.0–100.0)

## 2012-10-20 LAB — URINALYSIS, ROUTINE W REFLEX MICROSCOPIC
Bilirubin Urine: NEGATIVE
Glucose, UA: NEGATIVE mg/dL
Hgb urine dipstick: NEGATIVE
Ketones, ur: NEGATIVE mg/dL
Leukocytes, UA: NEGATIVE
Protein, ur: NEGATIVE mg/dL
pH: 6 (ref 5.0–8.0)

## 2012-10-20 LAB — PROTIME-INR
INR: 0.91 (ref 0.00–1.49)
Prothrombin Time: 12.1 seconds (ref 11.6–15.2)

## 2012-10-20 NOTE — Patient Instructions (Addendum)
YOUR SURGERY IS SCHEDULED AT [facility]  ON:  Monday  10/6  REPORT TO [place] [facility]AY [facility] AT:  10:45 AM      PHONE # FOR SHORT STAY IS [phone]  DO NOT EAT ANYTHING AFTER MIDNIGHT THE NIGHT BEFORE YOUR SURGERY.  NO FOOD, NO CHEWING GUM, NO MINTS, NO CANDIES, NO CHEWING TOBACCO. YOU MAY HAVE CLEAR LIQUIDS TO DRINK FROM MIDNIGHT UNTIL 7:30 AM DAY OF SURGERY - LIKE WATER,  TEA.     NOTHING TO DRINK AFTER 7:30 AM DAY OF SURGERY.  PLEASE TAKE THE FOLLOWING MEDICATIONS THE AM OF YOUR SURGERY WITH A FEW SIPS OF WATER:  OMEPRAZOLE ( PRILOSEC ).   USE YOUR ALBUTEROL INHALER.  IF YOU USE INHALERS--USE YOUR INHALERS THE AM OF YOUR SURGERY AND BRING INHALERS TO THE HOSPITAL.    IF YOU ARE DIABETIC:  DO NOT TAKE ANY DIABETIC MEDICATIONS THE AM OF YOUR SURGERY.    DO NOT BRING VALUABLES, MONEY, CREDIT CARDS.  DO NOT WEAR JEWELRY, MAKE-UP, NAIL POLISH AND NO METAL PINS OR CLIPS IN YOUR HAIR. CONTACT LENS, DENTURES / PARTIALS, GLASSES SHOULD NOT BE WORN TO SURGERY AND IN MOST CASES-HEARING AIDS WILL NEED TO BE REMOVED.  BRING YOUR GLASSES CASE, ANY EQUIPMENT NEEDED FOR YOUR CONTACT LENS. FOR PATIENTS ADMITTED TO THE HOSPITAL--CHECK OUT TIME THE DAY OF DISCHARGE IS 11:00 AM.  ALL INPATIENT ROOMS ARE PRIVATE - WITH BATHROOM, TELEPHONE, TELEVISION AND WIFI INTERNET.                             PLEASE READ OVER ANY  FACT SHEETS THAT YOU WERE GIVEN: MRSA INFORMATION, BLOOD TRANSFUSION INFORMATION, INCENTIVE SPIROMETER INFORMATION. FAILURE TO FOLLOW THESE INSTRUCTIONS MAY RESULT IN THE CANCELLATION OF YOUR SURGERY.    PATIENT SIGNATURE_________________________________

## 2012-10-20 NOTE — Pre-Procedure Instructions (Signed)
CARDIOLOGIST OFFICE NOTE DR. P. ROSS FROM 09/25/12, ECHO REPORT, NUCLEAR STRESS TEST REPORTS IN EPIC. EKG REPORT 07/20/12 IN EPIC. CXR WAS DONE TODAY. PT'S PREOP CBC REPORT - HGB 9.4 AND CMET REPORT - CREAT 1.34 FAXED TO DR. ALUISIO'S OFFICE.

## 2012-10-22 NOTE — Pre-Procedure Instructions (Signed)
DR. Genevie Cheshire CROSS MATCH FOR 2 UNITS PRBC'S - ORDER ENTERED INTO EPIC - BLOOD BANK NOTIFIED ( T /S WAS DRAWN ON 9/30 ).

## 2012-10-26 ENCOUNTER — Other Ambulatory Visit: Payer: Self-pay | Admitting: Orthopedic Surgery

## 2012-10-26 ENCOUNTER — Encounter (HOSPITAL_COMMUNITY)
Admission: RE | Disposition: A | Discharge status: Skilled Nursing Facility | Payer: Self-pay | Source: Ambulatory Visit | Attending: Orthopedic Surgery

## 2012-10-26 ENCOUNTER — Inpatient Hospital Stay (HOSPITAL_COMMUNITY)
Admission: RE | Admit: 2012-10-26 | Discharge: 2012-10-31 | DRG: 209 | Disposition: A | Discharge status: Skilled Nursing Facility | Payer: BC Managed Care – PPO | Source: Ambulatory Visit | Attending: Orthopedic Surgery | Admitting: Orthopedic Surgery

## 2012-10-26 ENCOUNTER — Inpatient Hospital Stay (HOSPITAL_COMMUNITY): Payer: BC Managed Care – PPO | Admitting: Anesthesiology

## 2012-10-26 ENCOUNTER — Encounter (HOSPITAL_COMMUNITY): Payer: Self-pay | Admitting: *Deleted

## 2012-10-26 ENCOUNTER — Encounter (HOSPITAL_COMMUNITY): Payer: Self-pay | Admitting: Anesthesiology

## 2012-10-26 DIAGNOSIS — M179 Osteoarthritis of knee, unspecified: Secondary | ICD-10-CM | POA: Diagnosis present

## 2012-10-26 DIAGNOSIS — Z860100 Personal history of colon polyps, unspecified: Secondary | ICD-10-CM

## 2012-10-26 DIAGNOSIS — M1710 Unilateral primary osteoarthritis, unspecified knee: Principal | ICD-10-CM | POA: Diagnosis present

## 2012-10-26 DIAGNOSIS — E119 Type 2 diabetes mellitus without complications: Secondary | ICD-10-CM | POA: Diagnosis present

## 2012-10-26 DIAGNOSIS — K219 Gastro-esophageal reflux disease without esophagitis: Secondary | ICD-10-CM | POA: Diagnosis present

## 2012-10-26 DIAGNOSIS — E785 Hyperlipidemia, unspecified: Secondary | ICD-10-CM | POA: Diagnosis present

## 2012-10-26 DIAGNOSIS — E042 Nontoxic multinodular goiter: Secondary | ICD-10-CM | POA: Diagnosis present

## 2012-10-26 DIAGNOSIS — Z96651 Presence of right artificial knee joint: Secondary | ICD-10-CM

## 2012-10-26 DIAGNOSIS — I10 Essential (primary) hypertension: Secondary | ICD-10-CM | POA: Diagnosis present

## 2012-10-26 HISTORY — PX: TOTAL KNEE ARTHROPLASTY: SHX125

## 2012-10-26 LAB — GLUCOSE, CAPILLARY
Glucose-Capillary: 130 mg/dL — ABNORMAL HIGH (ref 70–99)
Glucose-Capillary: 143 mg/dL — ABNORMAL HIGH (ref 70–99)

## 2012-10-26 SURGERY — ARTHROPLASTY, KNEE, TOTAL
Anesthesia: General | Site: Knee | Laterality: Right | Wound class: Clean

## 2012-10-26 MED ORDER — FAMOTIDINE IN NACL 20-0.9 MG/50ML-% IV SOLN
20.0000 mg | Freq: Once | INTRAVENOUS | Status: AC
  Administered 2012-10-26: 20 mg via INTRAVENOUS
  Filled 2012-10-26: qty 50

## 2012-10-26 MED ORDER — MIDAZOLAM HCL 2 MG/2ML IJ SOLN
INTRAMUSCULAR | Status: AC
  Administered 2012-10-26: 1 mg
  Filled 2012-10-26: qty 2

## 2012-10-26 MED ORDER — HYDROMORPHONE HCL PF 1 MG/ML IJ SOLN
INTRAMUSCULAR | Status: DC | PRN
  Administered 2012-10-26: 0.5 mg via INTRAVENOUS
  Administered 2012-10-26: 1 mg via INTRAVENOUS
  Administered 2012-10-26: 0.5 mg via INTRAVENOUS

## 2012-10-26 MED ORDER — FENTANYL CITRATE 0.05 MG/ML IJ SOLN
INTRAMUSCULAR | Status: DC | PRN
  Administered 2012-10-26: 100 ug via INTRAVENOUS
  Administered 2012-10-26: 50 ug via INTRAVENOUS
  Administered 2012-10-26: 100 ug via INTRAVENOUS

## 2012-10-26 MED ORDER — METHOCARBAMOL 500 MG PO TABS
500.0000 mg | ORAL_TABLET | Freq: Four times a day (QID) | ORAL | Status: DC | PRN
  Administered 2012-10-26 – 2012-10-31 (×15): 500 mg via ORAL
  Filled 2012-10-26 (×16): qty 1

## 2012-10-26 MED ORDER — POLYETHYLENE GLYCOL 3350 17 G PO PACK
17.0000 g | PACK | Freq: Every day | ORAL | Status: DC | PRN
  Administered 2012-10-29 – 2012-10-30 (×2): 17 g via ORAL

## 2012-10-26 MED ORDER — PANTOPRAZOLE SODIUM 40 MG PO TBEC
40.0000 mg | DELAYED_RELEASE_TABLET | Freq: Every day | ORAL | Status: DC
  Administered 2012-10-27 – 2012-10-28 (×2): 40 mg via ORAL
  Filled 2012-10-26 (×2): qty 1

## 2012-10-26 MED ORDER — SIMVASTATIN 40 MG PO TABS
40.0000 mg | ORAL_TABLET | Freq: Every evening | ORAL | Status: DC
  Administered 2012-10-26 – 2012-10-30 (×5): 40 mg via ORAL
  Filled 2012-10-26 (×8): qty 1

## 2012-10-26 MED ORDER — HYDROXYZINE HCL 25 MG PO TABS
25.0000 mg | ORAL_TABLET | Freq: Every day | ORAL | Status: DC
  Administered 2012-10-27 – 2012-10-30 (×4): 25 mg via ORAL
  Filled 2012-10-26 (×5): qty 1

## 2012-10-26 MED ORDER — OXYCODONE HCL 5 MG PO TABS
5.0000 mg | ORAL_TABLET | ORAL | Status: DC | PRN
  Administered 2012-10-26 – 2012-10-27 (×3): 5 mg via ORAL
  Administered 2012-10-27 (×2): 10 mg via ORAL
  Administered 2012-10-27: 5 mg via ORAL
  Administered 2012-10-28: 10 mg via ORAL
  Administered 2012-10-28 (×2): 5 mg via ORAL
  Administered 2012-10-28: 10 mg via ORAL
  Administered 2012-10-28 – 2012-10-29 (×5): 5 mg via ORAL
  Administered 2012-10-29 – 2012-10-31 (×10): 10 mg via ORAL
  Filled 2012-10-26: qty 1
  Filled 2012-10-26 (×3): qty 2
  Filled 2012-10-26 (×2): qty 1
  Filled 2012-10-26 (×2): qty 2
  Filled 2012-10-26: qty 1
  Filled 2012-10-26 (×2): qty 2
  Filled 2012-10-26: qty 1
  Filled 2012-10-26 (×2): qty 2
  Filled 2012-10-26: qty 1
  Filled 2012-10-26 (×2): qty 2
  Filled 2012-10-26: qty 1
  Filled 2012-10-26: qty 2
  Filled 2012-10-26: qty 1
  Filled 2012-10-26: qty 2
  Filled 2012-10-26: qty 1
  Filled 2012-10-26 (×2): qty 2
  Filled 2012-10-26 (×2): qty 1

## 2012-10-26 MED ORDER — GLYCOPYRROLATE 0.2 MG/ML IJ SOLN
INTRAMUSCULAR | Status: DC | PRN
  Administered 2012-10-26: .5 mg via INTRAVENOUS

## 2012-10-26 MED ORDER — PROPOFOL 10 MG/ML IV BOLUS
INTRAVENOUS | Status: DC | PRN
  Administered 2012-10-26: 150 mg via INTRAVENOUS

## 2012-10-26 MED ORDER — BUPIVACAINE LIPOSOME 1.3 % IJ SUSP
20.0000 mL | Freq: Once | INTRAMUSCULAR | Status: DC
  Filled 2012-10-26: qty 20

## 2012-10-26 MED ORDER — HYDROMORPHONE HCL PF 1 MG/ML IJ SOLN
0.2500 mg | INTRAMUSCULAR | Status: DC | PRN
  Administered 2012-10-26 (×2): 0.25 mg via INTRAVENOUS
  Administered 2012-10-26: 0.5 mg via INTRAVENOUS

## 2012-10-26 MED ORDER — ONDANSETRON HCL 4 MG/2ML IJ SOLN
INTRAMUSCULAR | Status: DC | PRN
  Administered 2012-10-26: 4 mg via INTRAMUSCULAR

## 2012-10-26 MED ORDER — LIDOCAINE HCL (PF) 2 % IJ SOLN
INTRAMUSCULAR | Status: DC | PRN
  Administered 2012-10-26: 75 mg

## 2012-10-26 MED ORDER — METOCLOPRAMIDE HCL 5 MG/ML IJ SOLN: 5.0000 mg | Freq: Three times a day (TID) | INTRAMUSCULAR | Status: DC | PRN

## 2012-10-26 MED ORDER — BUPIVACAINE LIPOSOME 1.3 % IJ SUSP: INTRAMUSCULAR | Status: DC | PRN

## 2012-10-26 MED ORDER — ONDANSETRON HCL 4 MG PO TABS: 4.0000 mg | ORAL_TABLET | Freq: Four times a day (QID) | ORAL | Status: DC | PRN

## 2012-10-26 MED ORDER — NEOSTIGMINE METHYLSULFATE 1 MG/ML IJ SOLN
INTRAMUSCULAR | Status: DC | PRN
  Administered 2012-10-26: 3.5 mg via INTRAVENOUS

## 2012-10-26 MED ORDER — PHENOL 1.4 % MT LIQD
1.0000 | OROMUCOSAL | Status: DC | PRN
  Filled 2012-10-26: qty 177

## 2012-10-26 MED ORDER — METFORMIN HCL ER 500 MG PO TB24
500.0000 mg | ORAL_TABLET | Freq: Every day | ORAL | Status: DC
  Administered 2012-10-27: 07:00:00 500 mg via ORAL
  Filled 2012-10-26 (×2): qty 1

## 2012-10-26 MED ORDER — DEXAMETHASONE SODIUM PHOSPHATE 10 MG/ML IJ SOLN
10.0000 mg | Freq: Once | INTRAMUSCULAR | Status: AC
  Administered 2012-10-26: 5 mg via INTRAVENOUS

## 2012-10-26 MED ORDER — SODIUM CHLORIDE 0.9 % IJ SOLN
INTRAMUSCULAR | Status: DC | PRN
  Administered 2012-10-26: 15:00:00

## 2012-10-26 MED ORDER — SIMVASTATIN 40 MG PO TABS: 40.0000 mg | ORAL_TABLET | Freq: Every evening | ORAL | Status: DC

## 2012-10-26 MED ORDER — MENTHOL 3 MG MT LOZG
1.0000 | LOZENGE | OROMUCOSAL | Status: DC | PRN
  Filled 2012-10-26: qty 9

## 2012-10-26 MED ORDER — DEXTROSE-NACL 5-0.9 % IV SOLN
INTRAVENOUS | Status: DC
  Administered 2012-10-26 – 2012-10-27 (×2): via INTRAVENOUS

## 2012-10-26 MED ORDER — FERROUS FUMARATE 325 (106 FE) MG PO TABS
1.0000 | ORAL_TABLET | Freq: Every day | ORAL | Status: DC
  Administered 2012-10-27 – 2012-10-31 (×5): 106 mg via ORAL
  Filled 2012-10-26 (×7): qty 1

## 2012-10-26 MED ORDER — VANCOMYCIN HCL IN DEXTROSE 1-5 GM/200ML-% IV SOLN
1000.0000 mg | Freq: Two times a day (BID) | INTRAVENOUS | Status: AC
  Administered 2012-10-27: 01:00:00 1000 mg via INTRAVENOUS
  Filled 2012-10-26: qty 200

## 2012-10-26 MED ORDER — OXYCODONE HCL 5 MG/5ML PO SOLN
5.0000 mg | Freq: Once | ORAL | Status: DC | PRN
Start: 2012-10-26 — End: 2012-10-26
  Filled 2012-10-26: qty 5

## 2012-10-26 MED ORDER — OXYCODONE HCL 5 MG PO TABS: 5.0000 mg | ORAL_TABLET | Freq: Once | ORAL | Status: DC | PRN

## 2012-10-26 MED ORDER — BUPIVACAINE HCL (PF) 0.25 % IJ SOLN
INTRAMUSCULAR | Status: AC
  Filled 2012-10-26: qty 30

## 2012-10-26 MED ORDER — METOCLOPRAMIDE HCL 10 MG PO TABS
5.0000 mg | ORAL_TABLET | Freq: Three times a day (TID) | ORAL | Status: DC | PRN
  Filled 2012-10-26: qty 2

## 2012-10-26 MED ORDER — HYDROMORPHONE HCL PF 1 MG/ML IJ SOLN
INTRAMUSCULAR | Status: AC
  Filled 2012-10-26: qty 1

## 2012-10-26 MED ORDER — ONDANSETRON HCL 4 MG/2ML IJ SOLN: 4.0000 mg | Freq: Four times a day (QID) | INTRAMUSCULAR | Status: DC | PRN

## 2012-10-26 MED ORDER — METHOCARBAMOL 100 MG/ML IJ SOLN
500.0000 mg | Freq: Four times a day (QID) | INTRAVENOUS | Status: DC | PRN
  Administered 2012-10-26: 500 mg via INTRAVENOUS
  Filled 2012-10-26: qty 5

## 2012-10-26 MED ORDER — DIPHENHYDRAMINE HCL 12.5 MG/5ML PO ELIX: 12.5000 mg | ORAL_SOLUTION | ORAL | Status: DC | PRN

## 2012-10-26 MED ORDER — DEXAMETHASONE 6 MG PO TABS
10.0000 mg | ORAL_TABLET | Freq: Every day | ORAL | Status: AC
  Administered 2012-10-27: 10:00:00 10 mg via ORAL
  Filled 2012-10-26: qty 1

## 2012-10-26 MED ORDER — CHLORHEXIDINE GLUCONATE 4 % EX LIQD
60.0000 mL | Freq: Once | CUTANEOUS | Status: DC
  Filled 2012-10-26: qty 60

## 2012-10-26 MED ORDER — ALBUTEROL SULFATE HFA 108 (90 BASE) MCG/ACT IN AERS
2.0000 | INHALATION_SPRAY | Freq: Four times a day (QID) | RESPIRATORY_TRACT | Status: DC | PRN
  Filled 2012-10-26: qty 6.7

## 2012-10-26 MED ORDER — RIVAROXABAN 10 MG PO TABS
10.0000 mg | ORAL_TABLET | Freq: Every day | ORAL | Status: DC
  Administered 2012-10-27 – 2012-10-31 (×5): 10 mg via ORAL
  Filled 2012-10-26 (×6): qty 1

## 2012-10-26 MED ORDER — ACETAMINOPHEN 500 MG PO TABS
1000.0000 mg | ORAL_TABLET | Freq: Four times a day (QID) | ORAL | Status: AC
  Administered 2012-10-26 – 2012-10-27 (×4): 1000 mg via ORAL
  Filled 2012-10-26 (×4): qty 2

## 2012-10-26 MED ORDER — KETOROLAC TROMETHAMINE 15 MG/ML IJ SOLN: 7.5000 mg | Freq: Four times a day (QID) | INTRAMUSCULAR | Status: AC | PRN

## 2012-10-26 MED ORDER — FLEET ENEMA 7-19 GM/118ML RE ENEM: 1.0000 | ENEMA | Freq: Once | RECTAL | Status: AC | PRN

## 2012-10-26 MED ORDER — LACTATED RINGERS IV SOLN
INTRAVENOUS | Status: DC
  Administered 2012-10-26: 14:00:00 via INTRAVENOUS
  Administered 2012-10-26: 1000 mL via INTRAVENOUS

## 2012-10-26 MED ORDER — MORPHINE SULFATE 2 MG/ML IJ SOLN
1.0000 mg | INTRAMUSCULAR | Status: DC | PRN
  Administered 2012-10-26: 1 mg via INTRAVENOUS
  Filled 2012-10-26: qty 1

## 2012-10-26 MED ORDER — MIDAZOLAM HCL 5 MG/5ML IJ SOLN
INTRAMUSCULAR | Status: DC | PRN
  Administered 2012-10-26: 2 mg via INTRAVENOUS

## 2012-10-26 MED ORDER — ROCURONIUM BROMIDE 100 MG/10ML IV SOLN
INTRAVENOUS | Status: DC | PRN
  Administered 2012-10-26: 50 mg via INTRAVENOUS

## 2012-10-26 MED ORDER — LOSARTAN POTASSIUM 25 MG PO TABS
25.0000 mg | ORAL_TABLET | Freq: Every evening | ORAL | Status: DC
  Administered 2012-10-26 – 2012-10-30 (×5): 25 mg via ORAL
  Filled 2012-10-26 (×6): qty 1

## 2012-10-26 MED ORDER — MEPERIDINE HCL 50 MG/ML IJ SOLN: 6.2500 mg | INTRAMUSCULAR | Status: DC | PRN

## 2012-10-26 MED ORDER — SODIUM CHLORIDE 0.9 % IJ SOLN
INTRAMUSCULAR | Status: AC
  Filled 2012-10-26: qty 50

## 2012-10-26 MED ORDER — SODIUM CHLORIDE 0.9 % IV SOLN: INTRAVENOUS | Status: DC

## 2012-10-26 MED ORDER — DOCUSATE SODIUM 100 MG PO CAPS
100.0000 mg | ORAL_CAPSULE | Freq: Two times a day (BID) | ORAL | Status: DC
  Administered 2012-10-26 – 2012-10-31 (×10): 100 mg via ORAL

## 2012-10-26 MED ORDER — BISACODYL 10 MG RE SUPP
10.0000 mg | Freq: Every day | RECTAL | Status: DC | PRN
  Administered 2012-10-29: 10 mg via RECTAL
  Filled 2012-10-26: qty 1

## 2012-10-26 MED ORDER — PROMETHAZINE HCL 25 MG/ML IJ SOLN: 6.2500 mg | INTRAMUSCULAR | Status: DC | PRN

## 2012-10-26 MED ORDER — FLUTICASONE PROPIONATE HFA 44 MCG/ACT IN AERO
1.0000 | INHALATION_SPRAY | Freq: Two times a day (BID) | RESPIRATORY_TRACT | Status: DC
  Administered 2012-10-26 – 2012-10-31 (×10): 1 via RESPIRATORY_TRACT
  Filled 2012-10-26: qty 10.6

## 2012-10-26 MED ORDER — VANCOMYCIN HCL IN DEXTROSE 1-5 GM/200ML-% IV SOLN
1000.0000 mg | INTRAVENOUS | Status: AC
  Administered 2012-10-26: 1000 mg via INTRAVENOUS

## 2012-10-26 MED ORDER — VANCOMYCIN HCL IN DEXTROSE 1-5 GM/200ML-% IV SOLN
INTRAVENOUS | Status: AC
  Filled 2012-10-26: qty 200

## 2012-10-26 MED ORDER — TRAMADOL HCL 50 MG PO TABS: 50.0000 mg | ORAL_TABLET | Freq: Four times a day (QID) | ORAL | Status: DC | PRN

## 2012-10-26 MED ORDER — BUPIVACAINE HCL 0.25 % IJ SOLN
INTRAMUSCULAR | Status: DC | PRN
  Administered 2012-10-26: 20 mL

## 2012-10-26 MED ORDER — ACETAMINOPHEN 500 MG PO TABS
1000.0000 mg | ORAL_TABLET | Freq: Once | ORAL | Status: AC
  Administered 2012-10-26: 1000 mg via ORAL
  Filled 2012-10-26: qty 2

## 2012-10-26 MED ORDER — DEXAMETHASONE SODIUM PHOSPHATE 10 MG/ML IJ SOLN
10.0000 mg | Freq: Every day | INTRAMUSCULAR | Status: AC
  Filled 2012-10-26: qty 1

## 2012-10-26 SURGICAL SUPPLY — 55 items
BAG ZIPLOCK 12X15 (MISCELLANEOUS) ×2 IMPLANT
BANDAGE ELASTIC 6 VELCRO ST LF (GAUZE/BANDAGES/DRESSINGS) ×2 IMPLANT
BANDAGE ESMARK 6X9 LF (GAUZE/BANDAGES/DRESSINGS) ×1 IMPLANT
BLADE SAG 18X100X1.27 (BLADE) ×2 IMPLANT
BLADE SAW SGTL 11.0X1.19X90.0M (BLADE) ×2 IMPLANT
BNDG ESMARK 6X9 LF (GAUZE/BANDAGES/DRESSINGS) ×2
BOWL SMART MIX CTS (DISPOSABLE) ×2 IMPLANT
CAPT RP KNEE ×2 IMPLANT
CEMENT HV SMART SET (Cement) ×4 IMPLANT
CLOTH BEACON ORANGE TIMEOUT ST (SAFETY) ×2 IMPLANT
CUFF TOURN SGL QUICK 34 (TOURNIQUET CUFF) ×1
CUFF TRNQT CYL 34X4X40X1 (TOURNIQUET CUFF) ×1 IMPLANT
DECANTER SPIKE VIAL GLASS SM (MISCELLANEOUS) ×2 IMPLANT
DRAPE EXTREMITY T 121X128X90 (DRAPE) ×2 IMPLANT
DRAPE POUCH INSTRU U-SHP 10X18 (DRAPES) ×2 IMPLANT
DRAPE U-SHAPE 47X51 STRL (DRAPES) ×2 IMPLANT
DRSG ADAPTIC 3X8 NADH LF (GAUZE/BANDAGES/DRESSINGS) ×2 IMPLANT
DRSG PAD ABDOMINAL 8X10 ST (GAUZE/BANDAGES/DRESSINGS) ×2 IMPLANT
DURAPREP 26ML APPLICATOR (WOUND CARE) ×2 IMPLANT
ELECT REM PT RETURN 9FT ADLT (ELECTROSURGICAL) ×2
ELECTRODE REM PT RTRN 9FT ADLT (ELECTROSURGICAL) ×1 IMPLANT
EVACUATOR 1/8 PVC DRAIN (DRAIN) ×2 IMPLANT
FACESHIELD LNG OPTICON STERILE (SAFETY) ×10 IMPLANT
GLOVE BIO SURGEON STRL SZ7.5 (GLOVE) ×10 IMPLANT
GLOVE BIO SURGEON STRL SZ8 (GLOVE) ×2 IMPLANT
GLOVE BIOGEL PI IND STRL 8 (GLOVE) ×2 IMPLANT
GLOVE BIOGEL PI INDICATOR 8 (GLOVE) ×2
GLOVE SURG SS PI 6.5 STRL IVOR (GLOVE) IMPLANT
GOWN PREVENTION PLUS LG XLONG (DISPOSABLE) ×2 IMPLANT
GOWN STRL REIN XL XLG (GOWN DISPOSABLE) ×6 IMPLANT
HANDPIECE INTERPULSE COAX TIP (DISPOSABLE) ×1
IMMOBILIZER KNEE 20 (SOFTGOODS) ×2
IMMOBILIZER KNEE 20 THIGH 36 (SOFTGOODS) ×1 IMPLANT
KIT BASIN OR (CUSTOM PROCEDURE TRAY) ×2 IMPLANT
MANIFOLD NEPTUNE II (INSTRUMENTS) ×2 IMPLANT
NDL SAFETY ECLIPSE 18X1.5 (NEEDLE) ×2 IMPLANT
NEEDLE HYPO 18GX1.5 SHARP (NEEDLE) ×2
NS IRRIG 1000ML POUR BTL (IV SOLUTION) ×2 IMPLANT
PACK TOTAL JOINT (CUSTOM PROCEDURE TRAY) ×2 IMPLANT
PADDING CAST COTTON 6X4 STRL (CAST SUPPLIES) ×6 IMPLANT
POSITIONER SURGICAL ARM (MISCELLANEOUS) ×2 IMPLANT
SET HNDPC FAN SPRY TIP SCT (DISPOSABLE) ×1 IMPLANT
SPONGE GAUZE 4X4 12PLY (GAUZE/BANDAGES/DRESSINGS) ×2 IMPLANT
STRIP CLOSURE SKIN 1/2X4 (GAUZE/BANDAGES/DRESSINGS) ×4 IMPLANT
SUCTION FRAZIER 12FR DISP (SUCTIONS) ×2 IMPLANT
SUT MNCRL AB 4-0 PS2 18 (SUTURE) ×2 IMPLANT
SUT VIC AB 2-0 CT1 27 (SUTURE) ×3
SUT VIC AB 2-0 CT1 TAPERPNT 27 (SUTURE) ×3 IMPLANT
SUT VLOC 180 0 24IN GS25 (SUTURE) IMPLANT
SYR 20CC LL (SYRINGE) ×2 IMPLANT
SYR 50ML LL SCALE MARK (SYRINGE) ×2 IMPLANT
TOWEL OR 17X26 10 PK STRL BLUE (TOWEL DISPOSABLE) ×4 IMPLANT
TRAY FOLEY CATH 14FRSI W/METER (CATHETERS) ×2 IMPLANT
WATER STERILE IRR 1500ML POUR (IV SOLUTION) ×4 IMPLANT
WRAP KNEE MAXI GEL POST OP (GAUZE/BANDAGES/DRESSINGS) ×2 IMPLANT

## 2012-10-26 NOTE — Anesthesia Postprocedure Evaluation (Signed)
  Anesthesia Post-op Note  Patient: Grace Cordova  Procedure(s) Performed: Procedure(s) (LRB): RIGHT TOTAL KNEE ARTHROPLASTY (Right)  Patient Location: PACU  Anesthesia Type: General  Level of Consciousness: awake and alert   Airway and Oxygen Therapy: Patient Spontanous Breathing  Post-op Pain: mild  Post-op Assessment: Post-op Vital signs reviewed, Patient's Cardiovascular Status Stable, Respiratory Function Stable, Patent Airway and No signs of Nausea or vomiting  Last Vitals:  Filed Vitals:   10/26/12 1545  BP: 144/56  Pulse: 59  Temp:   Resp: 8    Post-op Vital Signs: stable   Complications: No apparent anesthesia complications

## 2012-10-26 NOTE — Transfer of Care (Signed)
Immediate Anesthesia Transfer of Care Note  Patient: Grace Cordova  Procedure(s) Performed: Procedure(s): RIGHT TOTAL KNEE ARTHROPLASTY (Right)  Patient Location: PACU  Anesthesia Type:General  Level of Consciousness: awake, alert , oriented and patient cooperative  Airway & Oxygen Therapy: Patient Spontanous Breathing and Patient connected to face mask oxygen  Post-op Assessment: Report given to PACU RN and Post -op Vital signs reviewed and stable  Post vital signs: Reviewed and stable  Complications: No apparent anesthesia complications

## 2012-10-26 NOTE — Anesthesia Preprocedure Evaluation (Addendum)
Anesthesia Evaluation  Patient identified by MRN, date of birth, ID band Patient awake    Reviewed: Allergy & Precautions, H&P , NPO status , Patient's Chart, lab work & pertinent test results  Airway       Dental  (+) Dental Advisory Given   Pulmonary asthma ,          Cardiovascular hypertension, Pt. on medications     Neuro/Psych PSYCHIATRIC DISORDERS Anxiety negative neurological ROS     GI/Hepatic Neg liver ROS, GERD-  Medicated,  Endo/Other  diabetes, Type 2, Oral Hypoglycemic Agents  Renal/GU negative Renal ROS     Musculoskeletal negative musculoskeletal ROS (+)   Abdominal   Peds  Hematology negative hematology ROS (+)   Anesthesia Other Findings   Reproductive/Obstetrics negative OB ROS                           Anesthesia Physical Anesthesia Plan  ASA: II  Anesthesia Plan: General   Post-op Pain Management:    Induction: Intravenous  Airway Management Planned: Oral ETT  Additional Equipment:   Intra-op Plan:   Post-operative Plan: Extubation in OR  Informed Consent: I have reviewed the patients History and Physical, chart, labs and discussed the procedure including the risks, benefits and alternatives for the proposed anesthesia with the patient or authorized representative who has indicated his/her understanding and acceptance.   Dental advisory given  Plan Discussed with: CRNA  Anesthesia Plan Comments:        Anesthesia Quick Evaluation

## 2012-10-26 NOTE — Interval H&P Note (Signed)
History and Physical Interval Note:  10/26/2012 10:38 AM  Grace Cordova  has presented today for surgery, with the diagnosis of RIGHT KNEE OA  The various methods of treatment have been discussed with the patient and family. After consideration of risks, benefits and other options for treatment, the patient has consented to  Procedure(s): RIGHT TOTAL KNEE ARTHROPLASTY (Right) as a surgical intervention .  The patient's history has been reviewed, patient examined, no change in status, stable for surgery.  I have reviewed the patient's chart and labs.  Questions were answered to the patient's satisfaction.     Loanne Drilling

## 2012-10-26 NOTE — Op Note (Signed)
Pre-operative diagnosis- Osteoarthritis  Right knee(s)  Post-operative diagnosis- Osteoarthritis Right knee(s)  Procedure-  Right  Total Knee Arthroplasty  Surgeon- Gus Rankin. Khilee Hendricksen, MD  Assistant- Avel Peace, PA-C   Anesthesia-  General EBL-* No blood loss amount entered *  Drains Hemovac  Tourniquet time-  Total Tourniquet Time Documented: Thigh (Right) - 39 minutes Total: Thigh (Right) - 39 minutes    Complications- None  Condition-PACU - hemodynamically stable.   Brief Clinical Note  Grace Cordova is a 73 y.o. year old female with end stage OA of her right knee with progressively worsening pain and dysfunction. She has constant pain, with activity and at rest and significant functional deficits with difficulties even with ADLs. She has had extensive non-op management including analgesics, injections of cortisone, and home exercise program, but remains in significant pain with significant dysfunction.Radiographs show bone on bone arthritis medial and patellofemoral. She presents now for right Total Knee Arthroplasty.    Procedure in detail---   The patient is brought into the operating room and positioned supine on the operating table. After successful administration of  General,   a tourniquet is placed high on the  Right thigh(s) and the lower extremity is prepped and draped in the usual sterile fashion. Time out is performed by the operating team and then the  Right lower extremity is wrapped in Esmarch, knee flexed and the tourniquet inflated to 300 mmHg.       A midline incision is made with a ten blade through the subcutaneous tissue to the level of the extensor mechanism. A fresh blade is used to make a medial parapatellar arthrotomy. Soft tissue over the proximal medial tibia is subperiosteally elevated to the joint line with a knife and into the semimembranosus bursa with a Cobb elevator. Soft tissue over the proximal lateral tibia is elevated with attention being paid to  avoiding the patellar tendon on the tibial tubercle. The patella is everted, knee flexed 90 degrees and the ACL and PCL are removed. Findings are bone on bone medial and patellofemoral with large medial osteophytes.        The drill is used to create a starting hole in the distal femur and the canal is thoroughly irrigated with sterile saline to remove the fatty contents. The 5 degree Right  valgus alignment guide is placed into the femoral canal and the distal femoral cutting block is pinned to remove 10 mm off the distal femur. Resection is made with an oscillating saw.      The tibia is subluxed forward and the menisci are removed. The extramedullary alignment guide is placed referencing proximally at the medial aspect of the tibial tubercle and distally along the second metatarsal axis and tibial crest. The block is pinned to remove 2mm off the more deficient medial  side. Resection is made with an oscillating saw. Size 3is the most appropriate size for the tibia and the proximal tibia is prepared with the modular drill and keel punch for that size.      The femoral sizing guide is placed and size 4 narrow is most appropriate. Rotation is marked off the epicondylar axis and confirmed by creating a rectangular flexion gap at 90 degrees. The size 4 cutting block is pinned in this rotation and the anterior, posterior and chamfer cuts are made with the oscillating saw. The intercondylar block is then placed and that cut is made.      Trial size 3 tibial component, trial size 4 narrow posterior  stabilized femur and a 12.5  mm posterior stabilized rotating platform insert trial is placed. Full extension is achieved with excellent varus/valgus and anterior/posterior balance throughout full range of motion. The patella is everted and thickness measured to be 22  mm. Free hand resection is taken to 12 mm, a 35 template is placed, lug holes are drilled, trial patella is placed, and it tracks normally. Osteophytes are  removed off the posterior femur with the trial in place. All trials are removed and the cut bone surfaces prepared with pulsatile lavage. Cement is mixed and once ready for implantation, the size 3 tibial implant, size  4 narrow posterior stabilized femoral component, and the size 35 patella are cemented in place and the patella is held with the clamp. The trial insert is placed and the knee held in full extension. The Exparel (20 ml mixed with 30 ml saline) and .25% Bupivicaine, are injected into the extensor mechanism, posterior capsule, medial and lateral gutters and subcutaneous tissues.  All extruded cement is removed and once the cement is hard the permanent 12.5 mm posterior stabilized rotating platform insert is placed into the tibial tray.      The wound is copiously irrigated with saline solution and the extensor mechanism closed over a hemovac drain with #1 PDS suture. The tourniquet is released for a total tourniquet time of 39  minutes. Flexion against gravity is 140 degrees and the patella tracks normally. Subcutaneous tissue is closed with 2.0 vicryl and subcuticular with running 4.0 Monocryl. The incision is cleaned and dried and steri-strips and a bulky sterile dressing are applied. The limb is placed into a knee immobilizer and the patient is awakened and transported to recovery in stable condition.      Please note that a surgical assistant was a medical necessity for this procedure in order to perform it in a safe and expeditious manner. Surgical assistant was necessary to retract the ligaments and vital neurovascular structures to prevent injury to them and also necessary for proper positioning of the limb to allow for anatomic placement of the prosthesis.   Gus Rankin Olivier Frayre, MD    10/26/2012, 2:48 PM

## 2012-10-26 NOTE — H&P (View-Only) (Signed)
Grace Cordova  DOB: 09/06/1940 Divorced / Language: English / Race: Black or African American Female  Date of Admission: 10/26/2012  Chief Complaint:  Right Knee Pain  History of Present Illness The patient is a 72 year old female who comes in  for a preoperative History and Physical. The patient is scheduled for a right total knee arthroplasty to be performed by Dr. Frank V. Aluisio, MD at [facility] on 10/26/2012. The patient is a 72 year old female who presents to the practice today for a transition into care. The patient is transitioning into care from another physician (Dr Truselow and DrPeter Dalldorf for 2nd Opinion ) . The patient reports right knee symptoms including: pain (with activity), swelling, instability, stiffness, soreness and grinding (and popping) which began 1 year(s) ago without any known injury. The patient describes the severity of the symptoms as mild (and can be severe at times).The patient feels that the symptoms are worsening. Prior to being seen today the patient was previously evaluated by a colleague (Drs Truselow and Daldorf). The patient describes their pain as sharp, dull and aching. Onset of symptoms was gradual. Grace Cordova presents with the chief complaint of right knee pain. She reports that she has been having pain in the right knee for a little over a year. She did not have a specific injury but started to notice a steady increase in pain after working out. She has now gotten to where she can no longer exercise. She denies groin pain as well as numbness and tingling in the legs. She has seen Dr. Daldorf who told her that she had osteoarthritis. She has also seen Dr. Truselow who told her that she may have a medial meniscus tear. She is having some swelling in the knee as well as catching which she describes as the knee feeling "wobbly". She has had three cortisone injections in the right knee, each 3-4 weeks apart. She says that she had  relief for about 2 weeks with each. Her last injection was in April. She has not had viscosupplementation. Dr. Truselow told her that she may need an MRI of her right knee. She has pain primarily with weightbearing, especially going up and down stairs and prolonged walking. She is ready for a knee replacement. They have been treated conservatively in the past for the above stated problem and despite conservative measures, they continue to have progressive pain and severe functional limitations and dysfunction. They have failed non-operative management including home exercise, medications, and injections. It is felt that they would benefit from undergoing total joint replacement. Risks and benefits of the procedure have been discussed with the patient and they elect to proceed with surgery. There are no active contraindications to surgery such as ongoing infection or rapidly progressive neurological disease.    Problem List Primary osteoarthritis of one knee (715.16)    Allergies Penicillamine *ASSORTED CLASSES*. Burning with urination, itching Sulfabenzamide *CHEMICALS*. Burning with urination, itching    Family History Chronic Obstructive Lung Disease. mother Cerebrovascular Accident. mother Cancer. sister and brother Rheumatoid Arthritis. mother Osteoporosis. mother Osteoarthritis. mother Drug / Alcohol Addiction. grandfather mothers side and grandfather fathers side Diabetes Mellitus. father, grandmother mothers side and grandmother fathers side Congestive Heart Failure. mother Kidney disease. father Hypertension. mother and father Heart Disease. mother and father    Social History Alcohol use. current drinker; drinks wine; less than 5 per week Drug/Alcohol Rehab (Currently). no Current work status. working full time Children. 2 Tobacco use. former smoker;   smoke(d) 1 pack(s) per day Tobacco / smoke exposure. no Pain Contract. no Illicit drug  use. no Exercise. Exercises weekly; does running / walking Drug/Alcohol Rehab (Previously). no Number of flights of stairs before winded. 1 Marital status. divorced Living situation. live alone    Medication History Simvastatin (40MG Tablet, Oral) Active. (qd) ProAir HFA (108 (90 Base)MCG/ACT Aerosol Soln, Inhalation) Active. (prn) Omeprazole (20MG Capsule DR, Oral) Active. (qd) MetFORMIN HCl ER (500MG Tablet ER 24HR, Oral) Active. (qd) Meloxicam (15MG Tablet, Oral) Active. (prn) Losartan Potassium (25MG Tablet, Oral) Active. (qd) HydrOXYzine Pamoate (25MG Capsule, Oral) Active. (qd) Asmanex 120 Metered Doses (220MCG/INH Aero Pow Br Act, Inhalation) Active. (qd) Ferrous Sulfate (325 (65 Fe)MG Tablet, Oral) Active. Melatonin ER (10MG Tablet ER, Oral) Active.    Past Surgical History Rotator Cuff Repair. right Hysterectomy. complete (non-cancerous) Gallbladder Surgery. laporoscopic Colectomy. complete Cesarean Delivery. 2 times Cataract Surgery. left Dilation and Curettage of Uterus Colon Polyp Removal - Open Colon Polyp Removal - Colonoscopy   Medical History Sleep Apnea Gastroesophageal Reflux Disease Diverticulitis Of Colon Diabetes Mellitus, Type II Hypothyroidism Hypercholesterolemia High blood pressure Anxiety Disorder Depression Chronic Cystitis Asthma    Review of Systems General:Not Present- Chills, Fever, Night Sweats, Fatigue, Weight Gain, Weight Loss and Memory Loss. Skin:Not Present- Hives, Itching, Rash, Eczema and Lesions. HEENT:Not Present- Tinnitus, Headache, Double Vision, Visual Loss, Hearing Loss and Dentures. Respiratory:Present- Shortness of breath with exertion. Not Present- Shortness of breath at rest, Allergies, Coughing up blood and Chronic Cough. Cardiovascular:Not Present- Chest Pain, Racing/skipping heartbeats, Difficulty Breathing Lying Down, Murmur, Swelling and Palpitations. Gastrointestinal:Present-  Constipation. Not Present- Bloody Stool, Heartburn, Abdominal Pain, Vomiting, Nausea, Diarrhea, Difficulty Swallowing, Jaundice and Loss of appetitie. Female Genitourinary:Present- Urinary frequency and Urinating at Night. Not Present- Blood in Urine, Weak urinary stream, Discharge, Flank Pain, Incontinence, Painful Urination, Urgency and Urinary Retention. Musculoskeletal:Present- Joint Swelling and Joint Pain. Not Present- Muscle Weakness, Muscle Pain, Back Pain, Morning Stiffness and Spasms. Neurological:Not Present- Tremor, Dizziness, Blackout spells, Paralysis, Difficulty with balance and Weakness. Psychiatric:Not Present- Insomnia.    Vitals Weight: 201 lb Height: 65 in Weight was reported by patient. Height was reported by patient. Body Surface Area: 2.04 m Body Mass Index: 33.45 kg/m Pulse: 68 (Regular) Resp.: 14 (Unlabored) BP: 138/76 (Sitting, Left Arm, Standard)     Physical Exam The physical exam findings are as follows:   General Mental Status - Alert, cooperative and good historian. General Appearance- pleasant. Not in acute distress. Orientation- Oriented X3. Build & Nutrition- Well nourished and Well developed.   Head and Neck Head- normocephalic, atraumatic . Neck Global Assessment- supple. no bruit auscultated on the right and no bruit auscultated on the left. upper and lower dentures  Eye Pupil- Bilateral- Regular and Round. Motion- Bilateral- EOMI.   Chest and Lung Exam Auscultation: Breath sounds:- clear at anterior chest wall and - clear at posterior chest wall. Adventitious sounds:- No Adventitious sounds.   Cardiovascular Auscultation:Rhythm- Regular rate and rhythm. Heart Sounds- S1 WNL and S2 WNL. Murmurs & Other Heart Sounds: Murmur 1:Location- Aortic Area. Timing- Early systolic. Grade- II/VI.   Abdomen Palpation/Percussion:Tenderness- Abdomen is non-tender to palpation. Rigidity  (guarding)- Abdomen is soft. Auscultation:Auscultation of the abdomen reveals - Bowel sounds normal.   Female Genitourinary Not done, not pertinent to present illness  Musculoskeletal  On exam well developed female, alert and oriented in no apparent distress. Her hip shows normal range of motion with no discomfort. Her left knee shows no effusion. There is moderate crepitus on range   of motion of the left knee. Range about 0 to 125 with no tenderness or instability. The right knee no effusion. Range 5 to 120. Marked crepitus on range of motion. Tenderness medial greater than lateral with no instability noted. Pulse sensation and motor intact both lower extremities.  RADIOGRAPHS: Radiographs AP both knees and lateral of the right shows that she has bone on bone patellofemoral arthritis and near bone on bone medial compartment arthritis.   Assessment & Plan Primary osteoarthritis of one knee (715.16) Impression: Right Knee  Note: Plan is for a Right Total Knee Replacement by Dr. Aluisio.  Plan is to go to [facility].  PCP - Dr. Jenell Koberlaih  The patient does not have any contraindications and will receive TXA (tranexamic acid) prior to surgery.  Drew Travez Stancil, PA-C  

## 2012-10-26 NOTE — H&P (Signed)
Grace Cordova  DOB: Oct 09, 1940 Divorced / Language: [place] / Race: Black or African American Female  Date of Admission: 10/26/2012  Chief Complaint:  Right Knee Pain  History of Present Illness The patient is a 72 year old female who comes in  for a preoperative History and Physical. The patient is scheduled for a right total knee arthroplasty to be performed by Dr. Gus Rankin. Aluisio, MD at [facility] on 10/26/2012. The patient is a 72 year old female who presents to the practice today for a transition into care. The patient is transitioning into care from another physician (Dr Wynetta Fines and DrPeter Jerl Santos for 2nd Opinion ) . The patient reports right knee symptoms including: pain (with activity), swelling, instability, stiffness, soreness and grinding (and popping) which began 1 year(s) ago without any known injury. The patient describes the severity of the symptoms as mild (and can be severe at times).The patient feels that the symptoms are worsening. Prior to being seen today the patient was previously evaluated by a colleague (Drs Wynetta Fines and Yisroel Ramming). The patient describes their pain as sharp, dull and aching. Onset of symptoms was gradual. [name] presents with the chief complaint of right knee pain. She reports that she has been having pain in the right knee for a little over a year. She did not have a specific injury but started to notice a steady increase in pain after working out. She has now gotten to where she can no longer exercise. She denies groin pain as well as numbness and tingling in the legs. She has seen Dr. Yisroel Ramming who told her that she had osteoarthritis. She has also seen Dr. Wynetta Fines who told her that she may have a medial meniscus tear. She is having some swelling in the knee as well as catching which she describes as the knee feeling "wobbly". She has had three cortisone injections in the right knee, each 3-4 weeks apart. She says that she had  relief for about 2 weeks with each. Her last injection was in April. She has not had viscosupplementation. Dr. Wynetta Fines told her that she may need an MRI of her right knee. She has pain primarily with weightbearing, especially going up and down stairs and prolonged walking. She is ready for a knee replacement. They have been treated conservatively in the past for the above stated problem and despite conservative measures, they continue to have progressive pain and severe functional limitations and dysfunction. They have failed non-operative management including home exercise, medications, and injections. It is felt that they would benefit from undergoing total joint replacement. Risks and benefits of the procedure have been discussed with the patient and they elect to proceed with surgery. There are no active contraindications to surgery such as ongoing infection or rapidly progressive neurological disease.    Problem List Primary osteoarthritis of one knee (715.16)    Allergies Penicillamine *ASSORTED CLASSES*. Burning with urination, itching Sulfabenzamide *CHEMICALS*. Burning with urination, itching    Family History Chronic Obstructive Lung Disease. mother Cerebrovascular Accident. mother Cancer. sister and brother Rheumatoid Arthritis. mother Osteoporosis. mother Osteoarthritis. mother Drug / Alcohol Addiction. grandfather mothers side and grandfather fathers side Diabetes Mellitus. father, grandmother mothers side and grandmother fathers side Congestive Heart Failure. mother Kidney disease. father Hypertension. mother and father Heart Disease. mother and father    Social History Alcohol use. current drinker; drinks wine; less than 5 per week Drug/Alcohol Rehab (Currently). no Current work status. working full time Children. 2 Tobacco use. former smoker;  smoke(d) 1 pack(s) per day Tobacco / smoke exposure. no Pain Contract. no Illicit drug  use. no Exercise. Exercises weekly; does running / walking Drug/Alcohol Rehab (Previously). no Number of flights of stairs before winded. 1 Marital status. divorced Living situation. live alone    Medication History Simvastatin (40MG  Tablet, Oral) Active. (qd) ProAir HFA (108 (90 Base)MCG/ACT Aerosol Soln, Inhalation) Active. (prn) Omeprazole (20MG  Capsule DR, Oral) Active. (qd) MetFORMIN HCl ER (500MG  Tablet ER 24HR, Oral) Active. (qd) Meloxicam (15MG  Tablet, Oral) Active. (prn) Losartan Potassium (25MG  Tablet, Oral) Active. (qd) HydrOXYzine Pamoate (25MG  Capsule, Oral) Active. (qd) Asmanex 120 Metered Doses (220MCG/INH Aero Pow Br Act, Inhalation) Active. (qd) Ferrous Sulfate (325 (65 Fe)MG Tablet, Oral) Active. Melatonin ER (10MG  Tablet ER, Oral) Active.    Past Surgical History Rotator Cuff Repair. right Hysterectomy. complete (non-cancerous) Gallbladder Surgery. laporoscopic Colectomy. complete Cesarean Delivery. 2 times Cataract Surgery. left Dilation and Curettage of Uterus Colon Polyp Removal - Open Colon Polyp Removal - Colonoscopy   Medical History Sleep Apnea Gastroesophageal Reflux Disease Diverticulitis Of Colon Diabetes Mellitus, Type II Hypothyroidism Hypercholesterolemia High blood pressure Anxiety Disorder Depression Chronic Cystitis Asthma    Review of Systems General:Not Present- Chills, Fever, Night Sweats, Fatigue, Weight Gain, Weight Loss and Memory Loss. Skin:Not Present- Hives, Itching, Rash, Eczema and Lesions. HEENT:Not Present- Tinnitus, Headache, Double Vision, Visual Loss, Hearing Loss and Dentures. Respiratory:Present- Shortness of breath with exertion. Not Present- Shortness of breath at rest, Allergies, Coughing up blood and Chronic Cough. Cardiovascular:Not Present- Chest Pain, Racing/skipping heartbeats, Difficulty Breathing Lying Down, Murmur, Swelling and Palpitations. Gastrointestinal:Present-  Constipation. Not Present- Bloody Stool, Heartburn, Abdominal Pain, Vomiting, Nausea, Diarrhea, Difficulty Swallowing, Jaundice and Loss of appetitie. Female Genitourinary:Present- Urinary frequency and Urinating at Night. Not Present- Blood in Urine, Weak urinary stream, Discharge, Flank Pain, Incontinence, Painful Urination, Urgency and Urinary Retention. Musculoskeletal:Present- Joint Swelling and Joint Pain. Not Present- Muscle Weakness, Muscle Pain, Back Pain, Morning Stiffness and Spasms. Neurological:Not Present- Tremor, Dizziness, Blackout spells, Paralysis, Difficulty with balance and Weakness. Psychiatric:Not Present- Insomnia.    Vitals Weight: 201 lb Height: 65 in Weight was reported by patient. Height was reported by patient. Body Surface Area: 2.04 m Body Mass Index: 33.45 kg/m Pulse: 68 (Regular) Resp.: 14 (Unlabored) BP: 138/76 (Sitting, Left Arm, Standard)     Physical Exam The physical exam findings are as follows:   General Mental Status - Alert, cooperative and good historian. General Appearance- pleasant. Not in acute distress. Orientation- Oriented X3. Build & Nutrition- Well nourished and Well developed.   Head and Neck Head- normocephalic, atraumatic . Neck Global Assessment- supple. no bruit auscultated on the right and no bruit auscultated on the left. upper and lower dentures  Eye Pupil- Bilateral- Regular and Round. Motion- Bilateral- EOMI.   Chest and Lung Exam Auscultation: Breath sounds:- clear at anterior chest wall and - clear at posterior chest wall. Adventitious sounds:- No Adventitious sounds.   Cardiovascular Auscultation:Rhythm- Regular rate and rhythm. Heart Sounds- S1 WNL and S2 WNL. Murmurs & Other Heart Sounds: Murmur 1:Location- Aortic Area. Timing- Early systolic. Grade- II/VI.   Abdomen Palpation/Percussion:Tenderness- Abdomen is non-tender to palpation. Rigidity  (guarding)- Abdomen is soft. Auscultation:Auscultation of the abdomen reveals - Bowel sounds normal.   Female Genitourinary Not done, not pertinent to present illness  Musculoskeletal  On exam well developed female, alert and oriented in no apparent distress. Her hip shows normal range of motion with no discomfort. Her left knee shows no effusion. There is moderate crepitus on range  of motion of the left knee. Range about 0 to 125 with no tenderness or instability. The right knee no effusion. Range 5 to 120. Marked crepitus on range of motion. Tenderness medial greater than lateral with no instability noted. Pulse sensation and motor intact both lower extremities.  RADIOGRAPHS: Radiographs AP both knees and lateral of the right shows that she has bone on bone patellofemoral arthritis and near bone on bone medial compartment arthritis.   Assessment & Plan Primary osteoarthritis of one knee (715.16) Impression: Right Knee  Note: Plan is for a Right Total Knee Replacement by Dr. Lequita Halt.  Plan is to go to [facility].  PCP - Dr. Julius Bowels  The patient does not have any contraindications and will receive TXA (tranexamic acid) prior to surgery.  Avel Peace, PA-C

## 2012-10-27 ENCOUNTER — Encounter (HOSPITAL_COMMUNITY): Payer: Self-pay | Admitting: Orthopedic Surgery

## 2012-10-27 LAB — BASIC METABOLIC PANEL
Calcium: 8.9 mg/dL (ref 8.4–10.5)
Chloride: 104 mEq/L (ref 96–112)
GFR calc Af Amer: 39 mL/min — ABNORMAL LOW (ref >90)
GFR calc non Af Amer: 34 mL/min — ABNORMAL LOW (ref >90)
Glucose, Bld: 142 mg/dL — ABNORMAL HIGH (ref 70–99)
Potassium: 4.8 mEq/L (ref 3.5–5.1)
Sodium: 135 mEq/L (ref 135–145)

## 2012-10-27 LAB — CBC
Hemoglobin: 7.8 g/dL — ABNORMAL LOW (ref 12.0–15.0)
MCH: 28.3 pg (ref 26.0–34.0)
MCHC: 32.4 g/dL (ref 30.0–36.0)
MCV: 87.3 fL (ref 78.0–100.0)
Platelets: 254 10*3/uL (ref 150–400)
RDW: 14.2 % (ref 11.5–15.5)

## 2012-10-27 LAB — GLUCOSE, CAPILLARY
Glucose-Capillary: 126 mg/dL — ABNORMAL HIGH (ref 70–99)
Glucose-Capillary: 131 mg/dL — ABNORMAL HIGH (ref 70–99)
Glucose-Capillary: 157 mg/dL — ABNORMAL HIGH (ref 70–99)

## 2012-10-27 LAB — PREPARE RBC (CROSSMATCH)

## 2012-10-27 MED ORDER — ACETAMINOPHEN 325 MG PO TABS
650.0000 mg | ORAL_TABLET | Freq: Once | ORAL | Status: DC
  Filled 2012-10-27: qty 2

## 2012-10-27 MED ORDER — FUROSEMIDE 10 MG/ML IJ SOLN
10.0000 mg | Freq: Once | INTRAMUSCULAR | Status: AC
  Administered 2012-10-27: 10 mg via INTRAVENOUS
  Filled 2012-10-27: qty 1

## 2012-10-27 NOTE — Evaluation (Signed)
Occupational Therapy Evaluation Patient Details Name: Grace Cordova MRN: [id number] DOB: [birth year] Today's Date: 10/27/2012 Time: 0102-7253 OT Time Calculation (min): 28 min  OT Assessment / Plan / Recommendation History of present illness R TKA   Clinical Impression   Pt is very motivated to do what she can for herself. Tolerated up from bed for some functional mobility and then chair pulled up. She will benefit from skilled OT services to maximize ADL independence for next venue of care.     OT Assessment  Patient needs continued OT Services    Follow Up Recommendations  SNF;Supervision/Assistance - 24 hour    Barriers to Discharge      Equipment Recommendations  3 in 1 bedside comode    Recommendations for Other Services    Frequency  Min 2X/week    Precautions / Restrictions Precautions Precautions: Knee Required Braces or Orthoses: Knee Immobilizer - Right Restrictions Weight Bearing Restrictions: No   Pertinent Vitals/Pain BP at rest 122/48 BP at EOB 123/68 BP after ambulation 96/45 After resting several minutes 117/50 Nursing made aware.    ADL  Eating/Feeding: Independent Where Assessed - Eating/Feeding: Chair Grooming: Wash/dry hands;Set up Where Assessed - Grooming: Supported sitting Upper Body Bathing: Chest;Right arm;Left arm;Abdomen;Supervision/safety;Set up Where Assessed - Upper Body Bathing: Unsupported sitting Lower Body Bathing: Moderate assistance Where Assessed - Lower Body Bathing: Supported sit to stand Upper Body Dressing: Minimal assistance (due to IV) Where Assessed - Upper Body Dressing: Unsupported sitting Lower Body Dressing: Moderate assistance Where Assessed - Lower Body Dressing: Supported sit to stand Toilet Transfer: Minimal assistance Toilet Transfer Method: Sit to stand Toileting - Clothing Manipulation and Hygiene: Minimal assistance;Moderate assistance Where Assessed - Toileting Clothing Manipulation and Hygiene: Sit to  stand from 3-in-1 or toilet Equipment Used: Rolling walker ADL Comments: Pt able to reach down to ankle but not yet able to reach to toes to don sock so needed assist. Pt walked out in hallway (see PT note) but was fatigued at end of session. Chair pulled up. Pt is motivated and wants to do for herself.     OT Diagnosis: Generalized weakness  OT Problem List: Decreased strength;Decreased knowledge of use of DME or AE OT Treatment Interventions: Self-care/ADL training;DME and/or AE instruction;Therapeutic activities;Patient/family education   OT Goals(Current goals can be found in the care plan section) Acute Rehab OT Goals Patient Stated Goal: rehab to go back home OT Goal Formulation: With patient Time For Goal Achievement: 11/03/12 Potential to Achieve Goals: Good  Visit Information  Last OT Received On: 10/27/12 Assistance Needed: +2 (to follow with chair) PT/OT Co-Evaluation/Treatment: Yes History of Present Illness: R TKA       Prior Functioning     Home Living Family/patient expects to be discharged to:: Skilled nursing facility Living Arrangements: Alone Prior Function Level of Independence: Independent         Vision/Perception     Cognition  Cognition Arousal/Alertness: Awake/alert Behavior During Therapy: WFL for tasks assessed/performed Overall Cognitive Status: Within Functional Limits for tasks assessed    Extremity/Trunk Assessment Upper Extremity Assessment Upper Extremity Assessment: Overall WFL for tasks assessed     Mobility Bed Mobility Bed Mobility: Supine to Sit Supine to Sit: 4: Min assist;HOB elevated Details for Bed Mobility Assistance: assist for R LE over to EOB Transfers Transfers: Sit to Stand;Stand to Sit Sit to Stand: 7: Independent;4: Min assist;With upper extremity assist;From bed Stand to Sit: 4: Min assist;With upper extremity assist;To chair/3-in-1 Details for Transfer Assistance: verbal cues  for hand placement and R LE out  in front     Exercise     Balance     End of Session OT - End of Session Equipment Utilized During Treatment: Gait belt;Rolling walker;Right knee immobilizer Activity Tolerance: Patient tolerated treatment well Patient left: in chair;with call bell/phone within reach  GO     Lennox Laity [phone] 10/27/2012, 1:23 PM

## 2012-10-27 NOTE — Progress Notes (Signed)
Physical Therapy Treatment Patient Details Name: Grace Cordova MRN: [id number] DOB: 08-31-1940 Today's Date: 10/27/2012 Time: 1310-1340 PT Time Calculation (min): 30 min  PT Assessment / Plan / Recommendation  History of Present Illness R TKAon 10/26/12   PT Comments   Pt ambulated again, no dizziness. Pt to get blood.  Continue PT.  Follow Up Recommendations  SNF     Does the patient have the potential to tolerate intense rehabilitation     Barriers to Discharge Decreased caregiver support      Equipment Recommendations  None recommended by PT    Recommendations for Other Services    Frequency 7X/week   Progress towards PT Goals Progress towards PT goals: Progressing toward goals  Plan      Precautions / Restrictions Precautions Precautions: Knee Precaution Comments: low hgb Required Braces or Orthoses: Knee Immobilizer - Right Knee Immobilizer - Right: Discontinue once straight leg raise with < 10 degree lag Restrictions Weight Bearing Restrictions: No   Pertinent Vitals/Pain 6 R knee after walking.   Mobility  Bed Mobility Bed Mobility: Sit to Supine Supine to Sit: 4: Min assist;HOB elevated Sit to Supine: 3: Mod assist Details for Bed Mobility Assistance: assist for R LE  onto bed. Transfers Sit to Stand: 4: Min assist;With upper extremity assist;With armrests;From chair/3-in-1 Stand to Sit: 4: Min assist;With upper extremity assist;To bed Details for Transfer Assistance: verbal cues for hand placement and R LE out in front Ambulation/Gait Ambulation/Gait Assistance: 4: Min assist Ambulation/Gait: Patient Percentage: 80% Ambulation Distance (Feet): 75 Feet Assistive device: Rolling walker Ambulation/Gait Assistance Details: cues for sequence and position inside Rw. Gait Pattern: Step-to pattern;Antalgic;Decreased step length - right;Decreased stance time - right    Exercises Total Joint Exercises Ankle Circles/Pumps: AROM;Both;10 reps Quad Sets:  AROM;Right;Supine Heel Slides: AAROM;Supine Straight Leg Raises: AAROM;Right;10 reps;Supine   PT Diagnosis: Difficulty walking;Acute pain;Generalized weakness  PT Problem List: Decreased strength;Decreased range of motion;Decreased activity tolerance;Decreased mobility;Decreased knowledge of precautions;Decreased safety awareness;Decreased knowledge of use of DME;Cardiopulmonary status limiting activity PT Treatment Interventions: DME instruction;Gait training;Functional mobility training;Therapeutic exercise;Patient/family education   PT Goals (current goals can now be found in the care plan section) Acute Rehab PT Goals Patient Stated Goal: rehab to go back home PT Goal Formulation: With patient Time For Goal Achievement: 11/03/12 Potential to Achieve Goals: Good  Visit Information  Last PT Received On: 10/27/12 Assistance Needed: +1 History of Present Illness: R TKAon 10/26/12    Subjective Data  Patient Stated Goal: rehab to go back home   Cognition  Cognition Arousal/Alertness: Awake/alert Behavior During Therapy: [facility] for tasks assessed/performed Overall Cognitive Status: Within Functional Limits for tasks assessed    Balance     End of Session PT - End of Session Equipment Utilized During Treatment: Gait belt Activity Tolerance: Patient tolerated treatment well Patient left: in bed;with nursing/sitter in room;with call bell/phone within reach Nurse Communication: Mobility status (muscle r;axer.)   GP     Rada Hay 10/27/2012, 2:29 PM

## 2012-10-27 NOTE — Progress Notes (Signed)
PHARMACIST - PHYSICIAN COMMUNICATION DR:  Despina Hick CONCERNING:  METFORMIN SAFE ADMINISTRATION POLICY  RECOMMENDATION: Metformin has been placed on DISCONTINUE (rejected order) STATUS and should be reordered only after any of the conditions below are ruled out.  DESCRIPTION:  The Pharmacy Committee has adopted a policy that restricts the use of metformin in hospitalized patients until all the contraindications to administration have been ruled out. Specific contraindications are: [x]  Serum creatinine ? 1.4 for females   Lynann Beaver PharmD, [place] Pager [phone] 10/27/2012 11:36 AM

## 2012-10-27 NOTE — Evaluation (Addendum)
Physical Therapy Evaluation Patient Details Name: Grace Cordova MRN: [id number] DOB: 25-Nov-1940 Today's Date: 10/27/2012 Time: 5784-6962 PT Time Calculation (min): 28 min  PT Assessment / Plan / Recommendation History of Present Illness  R TKAon 10/26/12  Clinical Impression  Pt did ambulate a short distance. Noted decreased BP, ptpale and felt weak. Pt will benefit from PT to address problems listed. Pt plans to DC to snf.   PT Assessment  Patient needs continued PT services    Follow Up Recommendations  SNF    Does the patient have the potential to tolerate intense rehabilitation      Barriers to Discharge Decreased caregiver support      Equipment Recommendations  None recommended by PT    Recommendations for Other Services     Frequency 7X/week    Precautions / Restrictions Precautions Precautions: Knee Precaution Comments: low hgb Required Braces or Orthoses: Knee Immobilizer - Right Knee Immobilizer - Right: Discontinue once straight leg raise with < 10 degree lag Restrictions Weight Bearing Restrictions: No   Pertinent Vitals/Pain 3-7 with walking. Has had meds, ice applied.  BP after walking 93/45. hR 55      Mobility  Bed Mobility Bed Mobility: Supine to Sit Supine to Sit: 4: Min assist;HOB elevated Details for Bed Mobility Assistance: assist for R LE over to EOB Transfers Sit to Stand: 4: Min assist;With upper extremity assist;From bed Stand to Sit: 4: Min assist;With upper extremity assist;To chair/3-in-1 Details for Transfer Assistance: verbal cues for hand placement and R LE out in front Ambulation/Gait Ambulation/Gait Assistance: 1: +2 Total assist (for safety due to low hgb.) Ambulation/Gait: Patient Percentage: 80% Ambulation Distance (Feet): 50 Feet Assistive device: Rolling walker Ambulation/Gait Assistance Details: multimodal cues for safe use of RW, sequence and step length. Gait Pattern: Step-to pattern;Antalgic;Decreased step length -  right;Decreased stance time - right    Exercises Total Joint Exercises Quad Sets: AROM;Right;Supine   PT Diagnosis: Difficulty walking;Acute pain;Generalized weakness  PT Problem List: Decreased strength;Decreased range of motion;Decreased activity tolerance;Decreased mobility;Decreased knowledge of precautions;Decreased safety awareness;Decreased knowledge of use of DME;Cardiopulmonary status limiting activity PT Treatment Interventions: DME instruction;Gait training;Functional mobility training;Therapeutic exercise;Patient/family education     PT Goals(Current goals can be found in the care plan section) Acute Rehab PT Goals Patient Stated Goal: rehab to go back home PT Goal Formulation: With patient Time For Goal Achievement: 11/03/12 Potential to Achieve Goals: Good  Visit Information  Last PT Received On: 10/27/12 Assistance Needed: +2 History of Present Illness: R TKAon 10/26/12       Prior Functioning       Cognition  Cognition Arousal/Alertness: Awake/alert Behavior During Therapy: WFL for tasks assessed/performed Overall Cognitive Status: Within Functional Limits for tasks assessed    Extremity/Trunk Assessment Upper Extremity Assessment Upper Extremity Assessment: Overall WFL for tasks assessed Lower Extremity Assessment Lower Extremity Assessment: RLE deficits/detail RLE Deficits / Details: almost able to perform SLR. Cervical / Trunk Assessment Cervical / Trunk Assessment: Normal   Balance    End of Session PT - End of Session Equipment Utilized During Treatment: Gait belt Activity Tolerance: Patient limited by fatigue Patient left: with call bell/phone within reach Nurse Communication: Mobility status (BP)  GP     Rada Hay 10/27/2012, 2:24 PM  Blanchard Kelch PT [phone]

## 2012-10-27 NOTE — Progress Notes (Signed)
Clinical Social Work Department CLINICAL SOCIAL WORK PLACEMENT NOTE 10/27/2012  Patient:  Grace Cordova, Grace Cordova  Account Number:  [id number] Admit date:  10/26/2012  Clinical Social Worker:  [name], LCSW  Date/time:  10/27/2012 12:14 PM  Clinical Social Work is seeking post-discharge placement for this patient at the following level of care:   SKILLED NURSING   (*CSW will update this form in Epic as items are completed)   10/27/2012  Patient/family provided with [facility] Department of Clinical Social Work's list of facilities offering this level of care within the geographic area requested by the patient (or if unable, by the patient's family).  10/27/2012  Patient/family informed of their freedom to choose among providers that offer the needed level of care, that participate in Medicare, Medicaid or managed care program needed by the patient, have an available bed and are willing to accept the patient.  10/27/2012  Patient/family informed of [facility]' ownership interest in [facility], as well as of the fact that they are under no obligation to receive care at this facility.  PASARR submitted to EDS on 10/27/2012 PASARR number received from EDS on 10/27/2012  FL2 transmitted to all facilities in geographic area requested by pt/family on  10/27/2012 FL2 transmitted to all facilities within larger geographic area on   Patient informed that his/her managed care company has contracts with or will negotiate with  certain facilities, including the following:     Patient/family informed of bed offers received:  10/27/2012 Patient chooses bed at [facility] [place] Physician recommends and patient chooses bed at    Patient to be transferred to [facility] [place] on   Patient to be transferred to facility by   The following physician request were entered in Epic:   Additional Comments:  [name] LCSW [phone]

## 2012-10-27 NOTE — Progress Notes (Signed)
Clinical Social Work Department BRIEF PSYCHOSOCIAL ASSESSMENT 10/27/2012  Patient:  Grace Cordova, Grace Cordova     Account Number:  [id number]     Admit date:  10/26/2012  Clinical Social Worker:  Candie Chroman  Date/Time:  10/27/2012 12:06 PM  Referred by:  Physician  Date Referred:  10/27/2012 Referred for  SNF Placement   Other Referral:   Interview type:  Patient Other interview type:    PSYCHOSOCIAL DATA Living Status:  ALONE Admitted from facility:   Level of care:   Primary support name:  Anselm Jungling Primary support relationship to patient:  SIBLING Degree of support available:   unclear    CURRENT CONCERNS Current Concerns  Post-Acute Placement   Other Concerns:    SOCIAL WORK ASSESSMENT / PLAN Pt is a 72 yr old female living at home prior to hospitalization. CSW met with pt to assist with d/c planning. Pt will need ST Rehab following hospital d/c. SNF search in [place] initiated and bed offers provided. Pt has chosen [facility] . SNF is working with [employer] out of state for SNF authorization. CSW will assist with authorization process as needed.   Assessment/plan status:  Psychosocial Support/Ongoing Assessment of Needs Other assessment/ plan:   Information/referral to community resources:   SNF listed provided.    PATIENT'S/FAMILY'S RESPONSE TO PLAN OF CARE: Pt is looking forward to having rehab at [facility] .  [name] LCSW [phone]

## 2012-10-27 NOTE — Progress Notes (Signed)
   Subjective: 1 Day Post-Op Procedure(s) (LRB): RIGHT TOTAL KNEE ARTHROPLASTY (Right) Patient reports pain as mild.   Patient seen in rounds for Dr. Lequita Halt. Patient is well, and has had no acute complaints or problems We will start therapy today.  Plan is to go Skilled nursing facility after hospital stay.  Objective: Vital signs in last 24 hours: Temp:  [97.4 F (36.3 C)-98.7 F (37.1 C)] 98.1 F (36.7 C) (10/07 1050) Pulse Rate:  [55-95] 69 (10/07 1050) Resp:  [8-21] 18 (10/07 1152) BP: (96-163)/(45-72) 119/66 mmHg (10/07 1050) SpO2:  [96 %-100 %] 96 % (10/07 1050) Weight:  [91.173 kg (201 lb)] 91.173 kg (201 lb) (10/06 1712)  Intake/Output from previous day:  Intake/Output Summary (Last 24 hours) at 10/27/12 1239 Last data filed at 10/27/12 1050  Gross per 24 hour  Intake 2808.75 ml  Output   2655 ml  Net 153.75 ml    Intake/Output this shift: Total I/O In: 540 [P.O.:240; I.V.:300] Out: 450 [Urine:450]  Labs:  Recent Labs  10/27/12 0403  HGB 7.8*    Recent Labs  10/27/12 0403  WBC 8.5  RBC 2.76*  HCT 24.1*  PLT 254    Recent Labs  10/27/12 0403  NA 135  K 4.8  CL 104  CO2 21  BUN 19  CREATININE 1.49*  GLUCOSE 142*  CALCIUM 8.9   No results found for this basename: LABPT, INR,  in the last 72 hours  EXAM General - Patient is Alert, Appropriate and Oriented Extremity - Neurovascular intact Sensation intact distally Dressing - dressing C/D/I Motor Function - intact, moving foot and toes well on exam.  Hemovac pulled without difficulty.  Past Medical History  Diagnosis Date  . GOITER, MULTINODULAR 12/07/2009  . VITAMIN B12 DEFICIENCY 11/09/2009  . DM 11/09/2009  . ASTHMA 11/09/2009  . Hyperlipidemia   . Hypertension   . Anxiety   . Colon polyps 2009  . Pernicious anemia     B12 injections by PCP IN SEPT  . GERD (gastroesophageal reflux disease)   . Arthritis     BOTH KNEES - RIGHT KNEE PAIN WORSE    Assessment/Plan: 1 Day  Post-Op Procedure(s) (LRB): RIGHT TOTAL KNEE ARTHROPLASTY (Right) Principal Problem:   OA (osteoarthritis) of knee  Estimated body mass index is 33.45 kg/(m^2) as calculated from the following:   Height as of this encounter: 5\' 5"  (1.651 m).   Weight as of this encounter: 91.173 kg (201 lb). Advance diet Up with therapy Continue foley due to strict I&O and monitoring  DVT Prophylaxis - Xarelto Weight-Bearing as tolerated to right leg No vaccines. D/C O2 and Pulse OX and try on Room [address]  Patrica Duel 10/27/2012, 12:39 PM

## 2012-10-27 NOTE — Progress Notes (Signed)
Utilization review completed.  

## 2012-10-28 LAB — TYPE AND SCREEN
ABO/RH(D): A POS
Antibody Screen: NEGATIVE
Unit division: 0

## 2012-10-28 LAB — BASIC METABOLIC PANEL
CO2: 24 mEq/L (ref 19–32)
Calcium: 9.7 mg/dL (ref 8.4–10.5)
Creatinine, Ser: 1.49 mg/dL — ABNORMAL HIGH (ref 0.50–1.10)
GFR calc Af Amer: 39 mL/min — ABNORMAL LOW (ref >90)
GFR calc non Af Amer: 34 mL/min — ABNORMAL LOW (ref >90)

## 2012-10-28 LAB — CBC
MCH: 28.4 pg (ref 26.0–34.0)
MCHC: 32.9 g/dL (ref 30.0–36.0)
MCV: 86.5 fL (ref 78.0–100.0)
Platelets: 229 10*3/uL (ref 150–400)
RBC: 3.48 MIL/uL — ABNORMAL LOW (ref 3.87–5.11)
RDW: 14.7 % (ref 11.5–15.5)

## 2012-10-28 LAB — GLUCOSE, CAPILLARY
Glucose-Capillary: 105 mg/dL — ABNORMAL HIGH (ref 70–99)
Glucose-Capillary: 99 mg/dL (ref 70–99)

## 2012-10-28 MED ORDER — OMEPRAZOLE 20 MG PO CPDR
20.0000 mg | DELAYED_RELEASE_CAPSULE | Freq: Every day | ORAL | Status: DC
  Administered 2012-10-29 – 2012-10-31 (×3): 20 mg via ORAL
  Filled 2012-10-28 (×4): qty 1

## 2012-10-28 MED ORDER — NON FORMULARY: 20.0000 mg | Freq: Every day | Status: DC

## 2012-10-28 MED ORDER — ACETAMINOPHEN 325 MG PO TABS
650.0000 mg | ORAL_TABLET | ORAL | Status: DC | PRN
  Administered 2012-10-28: 325 mg via ORAL
  Administered 2012-10-28 – 2012-10-29 (×3): 650 mg via ORAL
  Filled 2012-10-28 (×3): qty 2

## 2012-10-28 MED ORDER — TEMAZEPAM 15 MG PO CAPS: 15.0000 mg | ORAL_CAPSULE | Freq: Every evening | ORAL | Status: DC | PRN

## 2012-10-28 NOTE — Care Management Note (Addendum)
    Page 1 of 1   10/30/2012     6:25:35 PM   CARE MANAGEMENT NOTE 10/30/2012  Patient:  Grace Cordova, Grace Cordova   Account Number:  [id number]  Date Initiated:  10/28/2012  Documentation initiated by:  Colleen Can  Subjective/Objective Assessment:   dx right knee osteoarthritis; total knee replacemnt     Action/Plan:   SNF rehab.   Anticipated DC Date:  10/28/2012   Anticipated DC Plan:  SKILLED NURSING FACILITY  In-house referral  Clinical Social Worker      DC Planning Services  CM consult      Choice offered to / List presented to:             Status of service:  Completed, signed off Medicare Important Message given?   (If response is "NO", the following Medicare IM given date fields will be blank) Date Medicare IM given:   Date Additional Medicare IM given:    Discharge Disposition:    Per UR Regulation:  Reviewed for med. necessity/level of care/duration of stay  If discussed at Long Length of Stay Meetings, dates discussed:    Comments:  10/30/2012 Colleen Can BSN RN CCM [phone] TC transfr from BC./BS Grace Cordova who is covering for Grace Cordova [phone]. She is requesting telephone review in order to get information to authorize skilled facility stay. Informationgiven from pt/ot notes, doctor's progress notes. Grace Cordova authorized snf stay from today  for7 days with authorization [phone]. Stay authorized up to the 17th. States dates will be adjusted if necessary. Not sure if [facility] will tke pt this PM due to late hour. Grace Cordova states she will call [facility] and advise them of authorization of SNF stay. SW on call notified of the above. She advised CSW asst director of the above. Received call from asst director SW- who advised that [facility] SNF will take patient tomorrow: Victoriano Lain SW will assist with transfer. 6E care co-ordinator advised ot SNF authorization.

## 2012-10-28 NOTE — Progress Notes (Signed)
Patient assisted to restroom by NT.  Patient alert and oriented, instructed to pull call string in restroom when ready to return to bed.  Patient was given privacy by NT.  Patient pulled shower call string in restroom, I responded and found the patient standing at bathroom door by herself.  Patient said when she reached for call string in shower she lost her balance and caught herself against the wall.  She denied any new pain, she was helped back to bed, she denied "falling."  Vitals taken, will continue to monitor.  Fall precaution teaching performed verbally with patient.

## 2012-10-28 NOTE — Progress Notes (Signed)
Physical Therapy Treatment Patient Details Name: Grace Cordova MRN: [id number] DOB: Oct 14, 1940 Today's Date: 10/28/2012 Time: 7829-5621 PT Time Calculation (min): 21 min  PT Assessment / Plan / Recommendation  History of Present Illness R TKAon 10/26/12   PT Comments   Pt reports pain 6-7 R knee.  Pt did ambulate . Pt requires cues for safe use of RW. Requires KI for stability. Pt will benefit from post acute rehab.  Follow Up Recommendations  SNF     Does the patient have the potential to tolerate intense rehabilitation     Barriers to Discharge        Equipment Recommendations  None recommended by PT    Recommendations for Other Services    Frequency 7X/week   Progress towards PT Goals Progress towards PT goals: Progressing toward goals  Plan Current plan remains appropriate    Precautions / Restrictions Precautions Precautions: Knee Required Braces or Orthoses: Knee Immobilizer - Right Knee Immobilizer - Right: Discontinue once straight leg raise with < 10 degree lag Restrictions Weight Bearing Restrictions: No   Pertinent Vitals/Pain 6 , ice applied to R knee.    Mobility  Bed Mobility Bed Mobility: Supine to Sit Supine to Sit: 4: Min assist Details for Bed Mobility Assistance: assist for R LE using sheet around the foot.  Transfers Sit to Stand: 4: Min assist;With upper extremity assist;From bed;From elevated surface Stand to Sit: 4: Min assist;With upper extremity assist;To chair/3-in-1;With armrests Details for Transfer Assistance: verbal cues for hand placement and R LE out in front Ambulation/Gait Ambulation/Gait Assistance: 4: Min assist Ambulation Distance (Feet): 125 Feet Assistive device: Rolling walker Ambulation/Gait Assistance Details: multimodal cues for safe and proper step length and posture, position inside of RW. Pt tends to take a long step on R. Gait Pattern: Step-to pattern;Antalgic;Decreased step length - right;Decreased stance time -  right Gait velocity: slow    Exercises     PT Diagnosis:    PT Problem List:   PT Treatment Interventions:     PT Goals (current goals can now be found in the care plan section)    Visit Information  Last PT Received On: 10/28/12 Assistance Needed: +1 History of Present Illness: R TKAon 10/26/12    Subjective Data      Cognition  Cognition Arousal/Alertness: Awake/alert Behavior During Therapy: WFL for tasks assessed/performed    Balance     End of Session PT - End of Session Activity Tolerance: Patient limited by fatigue;Patient limited by pain Patient left: in chair;with call bell/phone within reach Nurse Communication: Mobility status;Patient requests pain meds   GP     Rada Hay 10/28/2012, 10:39 AM

## 2012-10-28 NOTE — Progress Notes (Signed)
   Subjective: 2 Days Post-Op Procedure(s) (LRB): RIGHT TOTAL KNEE ARTHROPLASTY (Right) Patient reports pain as moderate.   Patient seen in rounds for Dr. Lequita Halt.  Patient had a rough night but better this morning. Patient is having problems with pain in the knee, requiring pain medications Plan is to go Skilled nursing facility after hospital stay.  Objective: Vital signs in last 24 hours: Temp:  [97.3 F (36.3 C)-98.6 F (37 C)] 98.3 F (36.8 C) (10/08 0334) Pulse Rate:  [54-74] 74 (10/08 0334) Resp:  [16-18] 16 (10/08 0334) BP: (107-146)/(60-77) 146/77 mmHg (10/08 0334) SpO2:  [95 %-99 %] 98 % (10/08 1033)  Intake/Output from previous day:  Intake/Output Summary (Last 24 hours) at 10/28/12 1049 Last data filed at 10/28/12 1038  Gross per 24 hour  Intake 1577.51 ml  Output   4200 ml  Net -2622.49 ml    Intake/Output this shift: Total I/O In: -  Out: 200 [Urine:200]  Labs:  Recent Labs  10/27/12 0403 10/28/12 0410  HGB 7.8* 9.9*    Recent Labs  10/27/12 0403 10/28/12 0410  WBC 8.5 10.2  RBC 2.76* 3.48*  HCT 24.1* 30.1*  PLT 254 229    Recent Labs  10/27/12 0403 10/28/12 0410  NA 135 138  K 4.8 4.1  CL 104 104  CO2 21 24  BUN 19 23  CREATININE 1.49* 1.49*  GLUCOSE 142* 135*  CALCIUM 8.9 9.7   No results found for this basename: LABPT, INR,  in the last 72 hours  EXAM General - Patient is Alert, Appropriate and Oriented Extremity - Neurovascular intact Sensation intact distally Dorsiflexion/Plantar flexion intact Dressing/Incision - clean, dry, no drainage, healing Motor Function - intact, moving foot and toes well on exam.   Past Medical History  Diagnosis Date  . GOITER, MULTINODULAR 12/07/2009  . VITAMIN B12 DEFICIENCY 11/09/2009  . DM 11/09/2009  . ASTHMA 11/09/2009  . Hyperlipidemia   . Hypertension   . Anxiety   . Colon polyps 2009  . Pernicious anemia     B12 injections by PCP IN SEPT  . GERD (gastroesophageal reflux  disease)   . Arthritis     BOTH KNEES - RIGHT KNEE PAIN WORSE    Assessment/Plan: 2 Days Post-Op Procedure(s) (LRB): RIGHT TOTAL KNEE ARTHROPLASTY (Right) Principal Problem:   OA (osteoarthritis) of knee  Estimated body mass index is 33.45 kg/(m^2) as calculated from the following:   Height as of this encounter: 5\' 5"  (1.651 m).   Weight as of this encounter: 91.173 kg (201 lb). Up with therapy Plan for discharge tomorrow Discharge to SNF  DVT Prophylaxis - Xarelto Weight-Bearing as tolerated to right leg  Grace Cordova 10/28/2012, 10:49 AM

## 2012-10-28 NOTE — Progress Notes (Signed)
Physical Therapy Treatment Patient Details Name: Grace Cordova MRN: [id number] DOB: 25-Dec-1940 Today's Date: 10/28/2012 Time: 9528-4132 PT Time Calculation (min): 35 min  PT Assessment / Plan / Recommendation  History of Present Illness R TKAon 10/26/12   PT Comments   Pt ambulated well without KI on R. Pt began to feel weak and dyspneic after 125 '. sats 96%, HR 60, BP 106/60. Continue to monitor VS during activity.  Follow Up Recommendations  SNF     Does the patient have the potential to tolerate intense rehabilitation     Barriers to Discharge        Equipment Recommendations  None recommended by PT    Recommendations for Other Services    Frequency 7X/week   Progress towards PT Goals Progress towards PT goals: Progressing toward goals  Plan Current plan remains appropriate    Precautions / Restrictions Precautions Precautions: Knee Required Braces or Orthoses: Knee Immobilizer - Right Knee Immobilizer - Right: Discontinue once straight leg raise with < 10 degree lag   Pertinent Vitals/Pain Pain is <3 , see COMMENTS FOR VS    Mobility  Bed Mobility Bed Mobility: Not assessed Supine to Sit: 4: Min assist Details for Bed Mobility Assistance: assist for R LE using sheet around the foot.  Transfers Sit to Stand: 4: Min assist;From elevated surface;From chair/3-in-1;With upper extremity assist Stand to Sit: 4: Min assist;With upper extremity assist;To chair/3-in-1;With armrests Details for Transfer Assistance: verbal cues for hand placement and R LE out in front as pt does not have KI in place. Ambulation/Gait Ambulation/Gait Assistance: 4: Min assist Ambulation Distance (Feet): 125 Feet Assistive device: Rolling walker Ambulation/Gait Assistance Details: pt noted with increased dysonea as pt got back to the room. VS taken. Gait Pattern: Step-to pattern;Antalgic;Decreased step length - right;Decreased stance time - right Gait velocity: slow General Gait Details:  multimodal cues for sequence , decrease R step length and to look ahead,    Exercises Total Joint Exercises Ankle Circles/Pumps: AROM;Both;10 reps Quad Sets: AROM;Supine;Both Long Arc Quad: AAROM;Right;10 reps Knee Flexion: AAROM;Right;10 reps Goniometric ROM: 10-60 R knee   PT Diagnosis:    PT Problem List:   PT Treatment Interventions:     PT Goals (current goals can now be found in the care plan section)    Visit Information  Last PT Received On: 10/28/12 Assistance Needed: +1 History of Present Illness: R TKAon 10/26/12    Subjective Data      Cognition  Cognition Arousal/Alertness: Awake/alert Behavior During Therapy: WFL for tasks assessed/performed    Balance     End of Session PT - End of Session Activity Tolerance: Patient limited by fatigue Patient left: in chair;with call bell/phone within reach Nurse Communication: Mobility status   GP     Rada Hay 10/28/2012, 1:18 PM

## 2012-10-28 NOTE — Progress Notes (Signed)
Patient prefer Tylenol for pain versus ultram or oxycodone. French Ana Shuford PA responded to [email or web address]. For tylenol 650mg  po every 4 hours prn pain.

## 2012-10-28 NOTE — Progress Notes (Signed)
Physical Therapy Treatment Patient Details Name: Grace Cordova MRN: [id number] DOB: 04/23/1940 Today's Date: 10/28/2012 Time: 4098-1191 PT Time Calculation (min): 14 min  PT Assessment / Plan / Recommendation  History of Present Illness R TKAon 10/26/12   PT Comments   Performed TKR TE's while pt was supine in bed.  Pt required increased time and freq rest breaks.  Follow Up Recommendations  SNF     Does the patient have the potential to tolerate intense rehabilitation     Barriers to Discharge        Equipment Recommendations       Recommendations for Other Services    Frequency 7X/week   Progress towards PT Goals Progress towards PT goals: Progressing toward goals  Plan Current plan remains appropriate    Precautions / Restrictions Precautions Precautions: Knee Required Braces or Orthoses: Knee Immobilizer - Right Knee Immobilizer - Right: Discontinue once straight leg raise with < 10 degree lag Restrictions Weight Bearing Restrictions: No    Pertinent Vitals/Pain C/o 5/10 during TE's ICE applied  Total Knee Replacement TE's 10 reps B LE ankle pumps 10 reps knee presses 10 reps heel slides  10 reps SAQ's 10 reps SLR's 10 reps ABD Followed by ICE       PT Goals (current goals can now be found in the care plan section)    Visit Information  Last PT Received On: 10/28/12 Assistance Needed: +1 History of Present Illness: R TKAon 10/26/12    Subjective Data      Cognition  Cognition Arousal/Alertness: Awake/alert    Balance     End of Session PT - End of Session Activity Tolerance: Patient limited by fatigue Patient left: in chair;with call bell/phone within reach Nurse Communication: Mobility status CPM Right Knee CPM Right Knee: On   Grace Cordova  PTA [facility]  Acute  Rehab Pager      [phone]

## 2012-10-29 LAB — CBC
HCT: 29.1 % — ABNORMAL LOW (ref 36.0–46.0)
Hemoglobin: 9.8 g/dL — ABNORMAL LOW (ref 12.0–15.0)
MCH: 29.1 pg (ref 26.0–34.0)
MCV: 86.4 fL (ref 78.0–100.0)
RBC: 3.37 MIL/uL — ABNORMAL LOW (ref 3.87–5.11)
RDW: 14.8 % (ref 11.5–15.5)

## 2012-10-29 LAB — GLUCOSE, CAPILLARY
Glucose-Capillary: 118 mg/dL — ABNORMAL HIGH (ref 70–99)
Glucose-Capillary: 128 mg/dL — ABNORMAL HIGH (ref 70–99)
Glucose-Capillary: 115 mg/dL — ABNORMAL HIGH (ref 70–99)

## 2012-10-29 MED ORDER — METOCLOPRAMIDE HCL 5 MG PO TABS: 5.0000 mg | ORAL_TABLET | Freq: Three times a day (TID) | ORAL | Status: AC | PRN

## 2012-10-29 MED ORDER — METHOCARBAMOL 500 MG PO TABS: 500.0000 mg | ORAL_TABLET | Freq: Four times a day (QID) | ORAL | Status: AC | PRN

## 2012-10-29 MED ORDER — DSS 100 MG PO CAPS: 100.0000 mg | ORAL_CAPSULE | Freq: Two times a day (BID) | ORAL | Status: AC

## 2012-10-29 MED ORDER — ACETAMINOPHEN 325 MG PO TABS: 650.0000 mg | ORAL_TABLET | ORAL | Status: AC | PRN

## 2012-10-29 MED ORDER — TEMAZEPAM 15 MG PO CAPS: 15.0000 mg | ORAL_CAPSULE | Freq: Every evening | ORAL | Status: AC | PRN

## 2012-10-29 MED ORDER — POLYETHYLENE GLYCOL 3350 17 G PO PACK: 17.0000 g | PACK | Freq: Every day | ORAL | Status: AC | PRN

## 2012-10-29 MED ORDER — OXYCODONE HCL 5 MG PO TABS: 5.0000 mg | ORAL_TABLET | ORAL | Status: AC | PRN

## 2012-10-29 MED ORDER — TRAMADOL HCL 50 MG PO TABS: 50.0000 mg | ORAL_TABLET | Freq: Four times a day (QID) | ORAL | Status: AC | PRN

## 2012-10-29 MED ORDER — BISACODYL 10 MG RE SUPP: 10.0000 mg | Freq: Every day | RECTAL | Status: AC | PRN

## 2012-10-29 MED ORDER — ONDANSETRON HCL 4 MG PO TABS: 4.0000 mg | ORAL_TABLET | Freq: Four times a day (QID) | ORAL | Status: AC | PRN

## 2012-10-29 MED ORDER — RIVAROXABAN 10 MG PO TABS: 10.0000 mg | ORAL_TABLET | Freq: Every day | ORAL | Status: AC

## 2012-10-29 NOTE — Progress Notes (Signed)
Occupational Therapy Treatment Patient Details Name: Grace Cordova MRN: [id number] DOB: [birth year] Today's Date: 10/29/2012 Time: 7829-5621 OT Time Calculation (min): 29 min  OT Assessment / Plan / Recommendation  History of present illness R TKAon 10/26/12   OT comments  Pt making progress with being able to tolerate increased activity and letting go of walker to use UEs in functional task of brushing teeth at the sink. Pt is very motivated.   Follow Up Recommendations  SNF;Supervision/Assistance - 24 hour    Barriers to Discharge       Equipment Recommendations  3 in 1 bedside comode    Recommendations for Other Services    Frequency Min 2X/week   Progress towards OT Goals Progress towards OT goals: Progressing toward goals  Plan Discharge plan remains appropriate    Precautions / Restrictions Precautions Precautions: Knee Required Braces or Orthoses: Knee Immobilizer - Right Knee Immobilizer - Right: Discontinue once straight leg raise with < 10 degree lag Restrictions Weight Bearing Restrictions: No   Pertinent Vitals/Pain 2/10; reposition    ADL  Grooming: Wash/dry hands;Teeth care;Min guard Where Assessed - Grooming: Unsupported standing Toilet Transfer: Minimal assistance Toilet Transfer Equipment: Raised toilet seat with arms (or 3-in-1 over toilet) Toileting - Clothing Manipulation and Hygiene: Minimal assistance Where Assessed - Toileting Clothing Manipulation and Hygiene: Sit to stand from 3-in-1 or toilet Equipment Used: Rolling walker    OT Diagnosis:    OT Problem List:   OT Treatment Interventions:     OT Goals(current goals can now be found in the care plan section)    Visit Information  Last OT Received On: 10/29/12 Assistance Needed: +1 History of Present Illness: R TKAon 10/26/12    Subjective Data      Prior Functioning       Cognition  Cognition Arousal/Alertness: Awake/alert Behavior During Therapy: WFL for tasks  assessed/performed Overall Cognitive Status: Within Functional Limits for tasks assessed    Mobility  Bed Mobility Bed Mobility: Supine to Sit Supine to Sit: 4: Min assist Transfers Transfers: Sit to Stand;Stand to Sit Sit to Stand: 4: Min assist;With upper extremity assist;From bed;From chair/3-in-1 Stand to Sit: 4: Min assist;With upper extremity assist;To chair/3-in-1 Details for Transfer Assistance: verbal cues for hand placement and R LE out in front    Exercises      Balance Balance Balance Assessed: Yes Dynamic Standing Balance Dynamic Standing - Level of Assistance: 4: Min assist   End of Session OT - End of Session Equipment Utilized During Treatment: Rolling walker Activity Tolerance: Patient tolerated treatment well Patient left: in chair;with call bell/phone within reach  GO     Lennox Laity [phone] 10/29/2012, 10:22 AM

## 2012-10-29 NOTE — Progress Notes (Signed)
Physical Therapy Treatment Patient Details Name: Grace Cordova MRN: [id number] DOB: [birth year] Today's Date: 10/29/2012 Time: 4098-1191 PT Time Calculation (min): 24 min  PT Assessment / Plan / Recommendation  History of Present Illness R TKAon 10/26/12   PT Comments   Pt tolerating ambulation without KI today. Slowly improving with R quad strength, still unable to perform SLR, Recommend SNF for rehab to become modified independent with ADL's.  Follow Up Recommendations  SNF     Does the patient have the potential to tolerate intense rehabilitation     Barriers to Discharge        Equipment Recommendations  None recommended by PT    Recommendations for Other Services    Frequency 7X/week   Progress towards PT Goals Progress towards PT goals: Progressing toward goals  Plan Current plan remains appropriate    Precautions / Restrictions Precautions Precautions: Knee;Fall Required Braces or Orthoses: Knee Immobilizer - Right Knee Immobilizer - Right: Discontinue once straight leg raise with < 10 degree lag   Pertinent Vitals/Pain Reports pain 3     Mobility  Bed Mobility Bed Mobility: Supine to Sit Supine to Sit: 4: Min assist Details for Bed Mobility Assistance: assist for R LE support  to lower to floor., multimodal cues to position on bed. Transfers Sit to Stand: 4: Min assist;With upper extremity assist;From bed Stand to Sit: 4: Min assist;With upper extremity assist;To chair/3-in-1 Details for Transfer Assistance: verbal cues for hand placement and R LE out in front, cues for UE use on bed to push up. Ambulation/Gait Ambulation/Gait Assistance: 4: Min assist Ambulation Distance (Feet): 130 Feet Assistive device: Rolling walker Ambulation/Gait Assistance Details: cues for sequence and posture, cues to shorten the R step length and step past with l. Gait Pattern: Step-to pattern;Antalgic;Decreased step length - right;Decreased stance time - right;Step-through  pattern Gait velocity: slow    Exercises Total Joint Exercises Ankle Circles/Pumps: AROM;Both;10 reps Quad Sets: AROM;Supine;Both;10 reps Heel Slides: AAROM;Supine;Right;10 reps Hip ABduction/ADduction: AAROM;Right;10 reps Straight Leg Raises: AAROM;Right;10 reps Long Arc Quad: AAROM;Right;10 reps Knee Flexion: AAROM;Right;10 reps Goniometric ROM: 10-65 R knee   PT Diagnosis:    PT Problem List:   PT Treatment Interventions:     PT Goals (current goals can now be found in the care plan section)    Visit Information  Last PT Received On: 10/29/12 Assistance Needed: +1 History of Present Illness: R TKAon 10/26/12    Subjective Data      Cognition  Cognition Arousal/Alertness: Awake/alert    Balance     End of Session PT - End of Session Equipment Utilized During Treatment: Gait belt Activity Tolerance: Patient tolerated treatment well Patient left: with call bell/phone within reach Nurse Communication: Mobility status;Patient requests pain meds   GP     Rada Hay 10/29/2012, 3:04 PM

## 2012-10-29 NOTE — Progress Notes (Signed)
CSW assisting with d/c planning. [facility] [place] contacted to check progress of [employer] authorization. SNF has not received authorization yet. Pt has been updated.   [name] LCSW [phone]

## 2012-10-29 NOTE — Discharge Summary (Signed)
Physician Discharge Summary   Patient ID: Grace Cordova MRN: [id number] DOB/AGE: 72-29-1942 72 y.o.  Admit date: 10/26/2012 Discharge date: 10/29/2012  Primary Diagnosis: Osteoarthritis Right knee(s)  Admission Diagnoses:  Past Medical History  Diagnosis Date  . GOITER, MULTINODULAR 12/07/2009  . VITAMIN B12 DEFICIENCY 11/09/2009  . DM 11/09/2009  . ASTHMA 11/09/2009  . Hyperlipidemia   . Hypertension   . Anxiety   . Colon polyps 2009  . Pernicious anemia     B12 injections by PCP IN SEPT  . GERD (gastroesophageal reflux disease)   . Arthritis     BOTH KNEES - RIGHT KNEE PAIN WORSE   Discharge Diagnoses:   Principal Problem:   OA (osteoarthritis) of knee  Estimated body mass index is 33.45 kg/(m^2) as calculated from the following:   Height as of this encounter: 5\' 5"  (1.651 m).   Weight as of this encounter: 91.173 kg (201 lb).  Procedure:  Procedure(s) (LRB): RIGHT TOTAL KNEE ARTHROPLASTY (Right)   Consults: None  HPI: Grace Cordova is a 72 y.o. year old female with end stage OA of her right knee with progressively worsening pain and dysfunction. She has constant pain, with activity and at rest and significant functional deficits with difficulties even with ADLs. She has had extensive non-op management including analgesics, injections of cortisone, and home exercise program, but remains in significant pain with significant dysfunction.Radiographs show bone on bone arthritis medial and patellofemoral. She presents now for right Total Knee Arthroplasty.   Laboratory Data: Admission on 10/26/2012  Component Date Value Range Status  . Order Confirmation 10/26/2012 ORDER PROCESSED BY BLOOD BANK   Final  . Glucose-Capillary 10/26/2012 119* 70 - 99 mg/dL Final  . Comment 1 08/65/7846 Documented in Chart   Final  . Comment 2 10/26/2012 Notify RN   Final  . Glucose-Capillary 10/26/2012 130* 70 - 99 mg/dL Final  . Comment 1 96/29/5284 Documented in Chart   Final  . Comment 2  10/26/2012 Notify RN   Final  . WBC 10/27/2012 8.5  4.0 - 10.5 K/uL Final  . RBC 10/27/2012 2.76* 3.87 - 5.11 MIL/uL Final  . Hemoglobin 10/27/2012 7.8* 12.0 - 15.0 g/dL Final  . HCT 13/24/4010 24.1* 36.0 - 46.0 % Final  . MCV 10/27/2012 87.3  78.0 - 100.0 fL Final  . MCH 10/27/2012 28.3  26.0 - 34.0 pg Final  . MCHC 10/27/2012 32.4  30.0 - 36.0 g/dL Final  . RDW 27/25/3664 14.2  11.5 - 15.5 % Final  . Platelets 10/27/2012 254  150 - 400 K/uL Final  . Sodium 10/27/2012 135  135 - 145 mEq/L Final  . Potassium 10/27/2012 4.8  3.5 - 5.1 mEq/L Final  . Chloride 10/27/2012 104  96 - 112 mEq/L Final  . CO2 10/27/2012 21  19 - 32 mEq/L Final  . Glucose, Bld 10/27/2012 142* 70 - 99 mg/dL Final  . BUN 40/34/7425 19  6 - 23 mg/dL Final  . Creatinine, Ser 10/27/2012 1.49* 0.50 - 1.10 mg/dL Final  . Calcium 95/63/8756 8.9  8.4 - 10.5 mg/dL Final  . GFR calc non Af Amer 10/27/2012 34* >90 mL/min Final  . GFR calc Af Amer 10/27/2012 39* >90 mL/min Final   Comment: (NOTE)                          The eGFR has been calculated using the CKD EPI equation.  This calculation has not been validated in all clinical situations.                          eGFR's persistently <90 mL/min signify possible Chronic Kidney                          Disease.  . Glucose-Capillary 10/26/2012 143* 70 - 99 mg/dL Final  . Glucose-Capillary 10/26/2012 152* 70 - 99 mg/dL Final  . Glucose-Capillary 10/27/2012 126* 70 - 99 mg/dL Final  . Comment 1 95/62/1308 Notify RN   Final  . Comment 2 10/27/2012 Documented in Chart   Final  . Glucose-Capillary 10/27/2012 131* 70 - 99 mg/dL Final  . Comment 1 65/78/4696 Notify RN   Final  . Comment 2 10/27/2012 Documented in Chart   Final  . Order Confirmation 10/27/2012 ORDER PROCESSED BY BLOOD BANK   Final  . Glucose-Capillary 10/27/2012 157* 70 - 99 mg/dL Final  . Comment 1 29/52/8413 Notify RN   Final  . Comment 2 10/27/2012 Documented in Chart   Final  .  WBC 10/28/2012 10.2  4.0 - 10.5 K/uL Final  . RBC 10/28/2012 3.48* 3.87 - 5.11 MIL/uL Final  . Hemoglobin 10/28/2012 9.9* 12.0 - 15.0 g/dL Final   Comment: DELTA CHECK NOTED                          POST TRANSFUSION SPECIMEN  . HCT 10/28/2012 30.1* 36.0 - 46.0 % Final  . MCV 10/28/2012 86.5  78.0 - 100.0 fL Final  . MCH 10/28/2012 28.4  26.0 - 34.0 pg Final  . MCHC 10/28/2012 32.9  30.0 - 36.0 g/dL Final  . RDW 24/40/1027 14.7  11.5 - 15.5 % Final  . Platelets 10/28/2012 229  150 - 400 K/uL Final  . Sodium 10/28/2012 138  135 - 145 mEq/L Final  . Potassium 10/28/2012 4.1  3.5 - 5.1 mEq/L Final  . Chloride 10/28/2012 104  96 - 112 mEq/L Final  . CO2 10/28/2012 24  19 - 32 mEq/L Final  . Glucose, Bld 10/28/2012 135* 70 - 99 mg/dL Final  . BUN 25/36/6440 23  6 - 23 mg/dL Final  . Creatinine, Ser 10/28/2012 1.49* 0.50 - 1.10 mg/dL Final  . Calcium 34/74/2595 9.7  8.4 - 10.5 mg/dL Final  . GFR calc non Af Amer 10/28/2012 34* >90 mL/min Final  . GFR calc Af Amer 10/28/2012 39* >90 mL/min Final   Comment: (NOTE)                          The eGFR has been calculated using the CKD EPI equation.                          This calculation has not been validated in all clinical situations.                          eGFR's persistently <90 mL/min signify possible Chronic Kidney                          Disease.  . Glucose-Capillary 10/28/2012 105* 70 - 99 mg/dL Final  . Comment 1 63/87/5643 Notify RN   Final  . Comment 2 10/28/2012 Documented in Chart  Final  . Glucose-Capillary 10/28/2012 104* 70 - 99 mg/dL Final  . Comment 1 91/47/8295 Notify RN   Final  . Comment 2 10/28/2012 Documented in Chart   Final  . WBC 10/29/2012 10.1  4.0 - 10.5 K/uL Final  . RBC 10/29/2012 3.37* 3.87 - 5.11 MIL/uL Final  . Hemoglobin 10/29/2012 9.8* 12.0 - 15.0 g/dL Final  . HCT 62/13/0865 29.1* 36.0 - 46.0 % Final  . MCV 10/29/2012 86.4  78.0 - 100.0 fL Final  . MCH 10/29/2012 29.1  26.0 - 34.0 pg Final  . MCHC  10/29/2012 33.7  30.0 - 36.0 g/dL Final  . RDW 78/46/9629 14.8  11.5 - 15.5 % Final  . Platelets 10/29/2012 216  150 - 400 K/uL Final  . Glucose-Capillary 10/28/2012 99  70 - 99 mg/dL Final  . Comment 1 52/84/1324 Notify RN   Final  . Comment 2 10/28/2012 Documented in Chart   Final  Hospital Outpatient Visit on 10/20/2012  Component Date Value Range Status  . aPTT 10/20/2012 38* 24 - 37 seconds Final   Comment:                                 IF BASELINE aPTT IS ELEVATED,                          SUGGEST PATIENT RISK ASSESSMENT                          BE USED TO DETERMINE APPROPRIATE                          ANTICOAGULANT THERAPY.  . WBC 10/20/2012 6.2  4.0 - 10.5 K/uL Final  . RBC 10/20/2012 3.35* 3.87 - 5.11 MIL/uL Final  . Hemoglobin 10/20/2012 9.4* 12.0 - 15.0 g/dL Final  . HCT 40/10/2723 29.1* 36.0 - 46.0 % Final  . MCV 10/20/2012 86.9  78.0 - 100.0 fL Final  . MCH 10/20/2012 28.1  26.0 - 34.0 pg Final  . MCHC 10/20/2012 32.3  30.0 - 36.0 g/dL Final  . RDW 36/64/4034 14.2  11.5 - 15.5 % Final  . Platelets 10/20/2012 275  150 - 400 K/uL Final  . Sodium 10/20/2012 139  135 - 145 mEq/L Final  . Potassium 10/20/2012 4.5  3.5 - 5.1 mEq/L Final  . Chloride 10/20/2012 105  96 - 112 mEq/L Final  . CO2 10/20/2012 23  19 - 32 mEq/L Final  . Glucose, Bld 10/20/2012 92  70 - 99 mg/dL Final  . BUN 74/25/9563 14  6 - 23 mg/dL Final  . Creatinine, Ser 10/20/2012 1.34* 0.50 - 1.10 mg/dL Final  . Calcium 87/56/4332 10.1  8.4 - 10.5 mg/dL Final  . Total Protein 10/20/2012 7.5  6.0 - 8.3 g/dL Final  . Albumin 95/18/8416 4.0  3.5 - 5.2 g/dL Final  . AST 60/63/0160 18  0 - 37 U/L Final  . ALT 10/20/2012 8  0 - 35 U/L Final  . Alkaline Phosphatase 10/20/2012 71  39 - 117 U/L Final  . Total Bilirubin 10/20/2012 0.2* 0.3 - 1.2 mg/dL Final  . GFR calc non Af Amer 10/20/2012 39* >90 mL/min Final  . GFR calc Af Amer 10/20/2012 45* >90 mL/min Final   Comment: (NOTE)  The  eGFR has been calculated using the CKD EPI equation.                          This calculation has not been validated in all clinical situations.                          eGFR's persistently <90 mL/min signify possible Chronic Kidney                          Disease.  Marland Kitchen Prothrombin Time 10/20/2012 12.1  11.6 - 15.2 seconds Final  . INR 10/20/2012 0.91  0.00 - 1.49 Final  . ABO/RH(D) 10/20/2012 A POS   Final  . Antibody Screen 10/20/2012 NEG   Final  . Sample Expiration 10/20/2012 10/29/2012   Final  . Unit Number 10/20/2012 [id number]   Final  . Blood Component Type 10/20/2012 RED CELLS,LR   Final  . Unit division 10/20/2012 00   Final  . Status of Unit 10/20/2012 ISSUED,FINAL   Final  . Transfusion Status 10/20/2012 OK TO TRANSFUSE   Final  . Crossmatch Result 10/20/2012 Compatible   Final  . Unit Number 10/20/2012 [id number]   Final  . Blood Component Type 10/20/2012 RED CELLS,LR   Final  . Unit division 10/20/2012 00   Final  . Status of Unit 10/20/2012 ISSUED,FINAL   Final  . Transfusion Status 10/20/2012 OK TO TRANSFUSE   Final  . Crossmatch Result 10/20/2012 Compatible   Final  . Color, Urine 10/20/2012 YELLOW  YELLOW Final  . APPearance 10/20/2012 CLEAR  CLEAR Final  . Specific Gravity, Urine 10/20/2012 1.013  1.005 - 1.030 Final  . pH 10/20/2012 6.0  5.0 - 8.0 Final  . Glucose, UA 10/20/2012 NEGATIVE  NEGATIVE mg/dL Final  . Hgb urine dipstick 10/20/2012 NEGATIVE  NEGATIVE Final  . Bilirubin Urine 10/20/2012 NEGATIVE  NEGATIVE Final  . Ketones, ur 10/20/2012 NEGATIVE  NEGATIVE mg/dL Final  . Protein, ur 30/86/5784 NEGATIVE  NEGATIVE mg/dL Final  . Urobilinogen, UA 10/20/2012 0.2  0.0 - 1.0 mg/dL Final  . Nitrite 69/62/9528 NEGATIVE  NEGATIVE Final  . Leukocytes, UA 10/20/2012 NEGATIVE  NEGATIVE Final   MICROSCOPIC NOT DONE ON URINES WITH NEGATIVE PROTEIN, BLOOD, LEUKOCYTES, NITRITE, OR GLUCOSE <1000 mg/dL.  Marland Kitchen MRSA, PCR 10/20/2012 NEGATIVE  NEGATIVE Final  .  Staphylococcus aureus 10/20/2012 NEGATIVE  NEGATIVE Final   Comment:                                 The Xpert SA Assay (FDA                          approved for NASAL specimens                          in patients over 94 years of age),                          is one component of                          a comprehensive surveillance  program.  Test performance has                          been validated by [facility] for patients greater                          than or equal to 92 year old.                          It is not intended                          to diagnose infection nor to                          guide or monitor treatment.  . ABO/RH(D) 10/20/2012 A POS   Final     X-Rays:Dg Chest 2 View  10/20/2012   CLINICAL DATA:  Pre operative respiratory exam. Osteoarthritis of the knee.  EXAM: CHEST  2 VIEW  COMPARISON:  06/13/2011  FINDINGS: The trachea is slightly deviated to the right at the level of the thoracic inlet. This is unchanged. The patient does have a history of multinodular goiter.  Heart size and pulmonary vascularity are normal and the lungs are clear. No acute osseous abnormality.  IMPRESSION: No active cardiopulmonary disease. 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   Electronically Signed   By: Geanie Cooley   On: 10/20/2012 12:48   Nm Myocar Single W/spect W/wall Motion And Ef  10/06/2012   Ordering Physician: Dietrich Pates  Reading Physician: Jonelle Sidle  Clinical Data: 72 year old woman with history of hypertension, hyperlipidemia, presenting with exertional fatigue.  The study is requested to evaluate for the presence of ischemia.  NUCLEAR MEDICINE STRESS MYOVIEW STUDY WITH SPECT AND LEFT VENTRICULAR EJECTION FRACTION  Radionuclide Data: One-day rest/stress protocol performed with 10/30 mCi of Tc-77m Myoview.  Stress Data: The patient exercised on a Bruce protocol  for 5 minutes and 59 seconds achieving a maximum workload of seven METS. No chest pain was reported.  Peak heart rate was 148 beats per minute, 100% maximum age predicted heart rate response.  Peak blood pressure 193/66.  Lead motion artifact noted.  No clearly diagnostic ST-segment abnormalities in this setting.  Rare PVCs.  EKG: Baseline tracing shows normal sinus rhythm at 63 beats per minute.  Scintigraphic Data: Analysis of the raw perfusion data finds breast attenuation artifact.  Tomographic views obtained using the short axis, vertical long axis, and horizontal long axis planes.  There is a small, mild to moderate intensity, fixed anteroseptal defect from mid to apical region.  This is most consistent with attenuation in light of normal wall motion in this region.  No definite ischemic defects.  Gated imaging reveals an EDV of 76, ESV of 18, T I D ratio of 0.72, and LVEF of 76% without wall motion abnormality.  IMPRESSION: Low risk exercise Myoview.  No diagnostic ST-segment abnormalities in the setting of lead motion artifact.  Hypertensive response noted.  No chest pain reported.  Perfusion imaging is consistent with breast attenuation, no definite scar or ischemia.  LVEF 76%.   Original Report  Authenticated By: Nona Dell    EKG: Orders placed in visit on 07/20/12  . EKG 12-LEAD     Hospital Course: REATA PETROV is a 72 y.o. who was admitted to [facility]. They were brought to the operating room on 10/26/2012 and underwent Procedure(s): RIGHT TOTAL KNEE ARTHROPLASTY.  Patient tolerated the procedure well and was later transferred to the recovery room and then to the orthopaedic floor for postoperative care.  They were given PO and IV analgesics for pain control following their surgery.  They were given 24 hours of postoperative antibiotics of  Anti-infectives   Start     Dose/Rate Route Frequency Ordered Stop   10/27/12 0100  vancomycin (VANCOCIN) IVPB 1000 mg/200 mL premix      1,000 mg 200 mL/hr over 60 Minutes Intravenous Every 12 hours 10/26/12 1718 10/27/12 0153   10/26/12 1050  vancomycin (VANCOCIN) IVPB 1000 mg/200 mL premix     1,000 mg 200 mL/hr over 60 Minutes Intravenous On call to O.R. 10/26/12 1050 10/26/12 1310     and started on DVT prophylaxis in the form of Xarelto.   PT and OT were ordered for total joint protocol.  Discharge planning consulted to help with postop disposition and equipment needs.  Patient had a tough night on the evening of surgery but was feeling a little better the next morning.  They started to get up OOB with therapy on day one. Hemovac drain was pulled without difficulty.  Continued to work with therapy into day two.  Dressing was changed on day two and the incision was healing well.  By day three, the patient had progressed with therapy and meeting their goals.  Incision was healing well.  Patient was seen in rounds and was ready to go to the SNF - [facility].   Discharge Medications: Prior to Admission medications   Medication Sig Start Date End Date Taking? Authorizing Provider  albuterol (PROAIR HFA) 108 (90 BASE) MCG/ACT inhaler Inhale 2 puffs into the lungs every 6 (six) hours as needed for wheezing. 06/19/12  Yes Storm Frisk, MD  hydrOXYzine (VISTARIL) 25 MG capsule Take 1 capsule by mouth daily. TAKES AT BEDTIME - HELPS ANXIETY AND ALLERGIES   Yes Historical Provider, MD  losartan (COZAAR) 25 MG tablet Take 25 mg by mouth every evening.    Yes Historical Provider, MD  metFORMIN (GLUCOPHAGE-XR) 500 MG 24 hr tablet Take 1 tablet (500 mg total) by mouth daily with breakfast. 10/09/12 10/09/13 Yes Romero Belling, MD  mometasone [facility]) 220 MCG/INH inhaler Inhale 2 puffs into the lungs daily. AT BEDTIME   Yes Historical Provider, MD  omeprazole (PRILOSEC) 20 MG capsule Take 1 capsule (20 mg total) by mouth daily. 02/06/12  Yes Storm Frisk, MD  simvastatin (ZOCOR) 40 MG tablet Take 40 mg by mouth every evening.     Yes Historical Provider, MD  acetaminophen (TYLENOL) 325 MG tablet Take 2 tablets (650 mg total) by mouth every 4 (four) hours as needed. 10/29/12   Judah Carchi, PA-C  bisacodyl (DULCOLAX) 10 MG suppository Place 1 suppository (10 mg total) rectally daily as needed. 10/29/12   Samael Blades, PA-C  docusate sodium 100 MG CAPS Take 100 mg by mouth 2 (two) times daily. 10/29/12   Maxtyn Nuzum, PA-C  ferrous fumarate (HEMOCYTE - 106 MG FE) 325 (106 FE) MG TABS tablet Take 1 tablet by mouth daily.    Historical Provider, MD  methocarbamol (ROBAXIN) 500 MG tablet Take 1  tablet (500 mg total) by mouth every 6 (six) hours as needed. 10/29/12   Jillian Warth Julien Girt, PA-C  metoCLOPramide (REGLAN) 5 MG tablet Take 1-2 tablets (5-10 mg total) by mouth every 8 (eight) hours as needed (if ondansetron (ZOFRAN) ineffective.). 10/29/12   Hanalei Glace, PA-C  ondansetron (ZOFRAN) 4 MG tablet Take 1 tablet (4 mg total) by mouth every 6 (six) hours as needed for nausea. 10/29/12   Valora Norell, PA-C  oxyCODONE (OXY IR/ROXICODONE) 5 MG immediate release tablet Take 1-2 tablets (5-10 mg total) by mouth every 3 (three) hours as needed. 10/29/12   Cambry Spampinato, PA-C  polyethylene glycol (MIRALAX / GLYCOLAX) packet Take 17 g by mouth daily as needed. 10/29/12   Jakirah Zaun Julien Girt, PA-C  rivaroxaban (XARELTO) 10 MG TABS tablet Take 1 tablet (10 mg total) by mouth daily with breakfast. Take Xarelto for two and a half more weeks, then discontinue Xarelto. Once the patient has completed the blood thinner regimen, then take a Baby 81 mg Aspirin daily for four more weeks. Once the patient has completed the Xarelto, they may resume their Mobic. 10/29/12   Ramonda Galyon, PA-C  temazepam (RESTORIL) 15 MG capsule Take 1 capsule (15 mg total) by mouth at bedtime as needed for sleep. 10/29/12   Berkeley Veldman, PA-C  traMADol (ULTRAM) 50 MG tablet Take 1-2 tablets (50-100 mg total) by mouth every 6  (six) hours as needed (mild pain). 10/29/12   Shaniqwa Horsman Julien Girt, PA-C    Diet: Cardiac diet and Diabetic diet Activity:WBAT Follow-up:in 2 weeks Disposition - Skilled nursing facility - [facility] Discharged Condition: good   Discharge Orders   Future Orders Complete By Expires   Call MD / Call 911  As directed    Comments:     If you experience chest pain or shortness of breath, CALL 911 and be transported to the hospital emergency room.  If you develope a fever above 101 F, pus (white drainage) or increased drainage or redness at the wound, or calf pain, call your surgeon's office.   Change dressing  As directed    Comments:     Change dressing daily with sterile 4 x 4 inch gauze dressing and apply TED hose. Do not submerge the incision under water.   Constipation Prevention  As directed    Comments:     Drink plenty of fluids.  Prune juice may be helpful.  You may use a stool softener, such as Colace (over the counter) 100 mg twice a day.  Use MiraLax (over the counter) for constipation as needed.   Diet - low sodium heart healthy  As directed    Diet Carb Modified  As directed    Discharge instructions  As directed    Comments:     Pick up stool softner and laxative for home. Do not submerge incision under water. May shower. Continue to use ice for pain and swelling from surgery.  Take Xarelto for two and a half more weeks, then discontinue Xarelto. Once the patient has completed the blood thinner regimen, then take a Baby 81 mg Aspirin daily for four more weeks.  When discharged from the skilled rehab facility, please have the facility set up the patient's Home Health Physical Therapy prior to being released.  Also provide the patient with their medications at time of release from the facility to include their pain medication, the muscle relaxants, and their blood thinner medication.  If the patient is still at the rehab facility at time  of follow up appointment,  please also assist the patient in arranging follow up appointment in our office and any transportation needs.   Do not put a pillow under the knee. Place it under the heel.  As directed    Do not sit on low chairs, stoools or toilet seats, as it may be difficult to get up from low surfaces  As directed    Driving restrictions  As directed    Comments:     No driving until released by the physician.   Increase activity slowly as tolerated  As directed    Lifting restrictions  As directed    Comments:     No lifting until released by the physician.   Patient may shower  As directed    Comments:     You may shower without a dressing once there is no drainage.  Do not wash over the wound.  If drainage remains, do not shower until drainage stops.   TED hose  As directed    Comments:     Use stockings (TED hose) for 3 weeks on both leg(s).  You may remove them at night for sleeping.   Weight bearing as tolerated  As directed    Questions:     Laterality:     Extremity:         Medication List    STOP taking these medications       meloxicam 15 MG tablet  Commonly known as:  MOBIC      TAKE these medications       acetaminophen 325 MG tablet  Commonly known as:  TYLENOL  Take 2 tablets (650 mg total) by mouth every 4 (four) hours as needed.     albuterol 108 (90 BASE) MCG/ACT inhaler  Commonly known as:  PROAIR HFA  Inhale 2 puffs into the lungs every 6 (six) hours as needed for wheezing.     bisacodyl 10 MG suppository  Commonly known as:  DULCOLAX  Place 1 suppository (10 mg total) rectally daily as needed.     DSS 100 MG Caps  Take 100 mg by mouth 2 (two) times daily.     ferrous fumarate 325 (106 FE) MG Tabs tablet  Commonly known as:  HEMOCYTE - 106 mg FE  Take 1 tablet by mouth daily.     hydrOXYzine 25 MG capsule  Commonly known as:  VISTARIL  Take 1 capsule by mouth daily. TAKES AT BEDTIME - HELPS ANXIETY AND ALLERGIES     losartan 25 MG tablet  Commonly  known as:  COZAAR  Take 25 mg by mouth every evening.     metFORMIN 500 MG 24 hr tablet  Commonly known as:  GLUCOPHAGE-XR  Take 1 tablet (500 mg total) by mouth daily with breakfast.     methocarbamol 500 MG tablet  Commonly known as:  ROBAXIN  Take 1 tablet (500 mg total) by mouth every 6 (six) hours as needed.     metoCLOPramide 5 MG tablet  Commonly known as:  REGLAN  Take 1-2 tablets (5-10 mg total) by mouth every 8 (eight) hours as needed (if ondansetron (ZOFRAN) ineffective.).     mometasone 220 MCG/INH inhaler  Commonly known as:  ASMANEX  Inhale 2 puffs into the lungs daily. AT BEDTIME     omeprazole 20 MG capsule  Commonly known as:  PRILOSEC  Take 1 capsule (20 mg total) by mouth daily.     ondansetron 4 MG tablet  Commonly  known as:  ZOFRAN  Take 1 tablet (4 mg total) by mouth every 6 (six) hours as needed for nausea.     oxyCODONE 5 MG immediate release tablet  Commonly known as:  Oxy IR/ROXICODONE  Take 1-2 tablets (5-10 mg total) by mouth every 3 (three) hours as needed.     polyethylene glycol packet  Commonly known as:  MIRALAX / GLYCOLAX  Take 17 g by mouth daily as needed.     rivaroxaban 10 MG Tabs tablet  Commonly known as:  XARELTO  - Take 1 tablet (10 mg total) by mouth daily with breakfast. Take Xarelto for two and a half more weeks, then discontinue Xarelto.  - Once the patient has completed the blood thinner regimen, then take a Baby 81 mg Aspirin daily for four more weeks.  - Once the patient has completed the Xarelto, they may resume their Mobic.     simvastatin 40 MG tablet  Commonly known as:  ZOCOR  Take 40 mg by mouth every evening.     temazepam 15 MG capsule  Commonly known as:  RESTORIL  Take 1 capsule (15 mg total) by mouth at bedtime as needed for sleep.     traMADol 50 MG tablet  Commonly known as:  ULTRAM  Take 1-2 tablets (50-100 mg total) by mouth every 6 (six) hours as needed (mild pain).           Follow-up  Information   Follow up with Loanne Drilling, MD. Schedule an appointment as soon as possible for a visit in 2 weeks.   Specialty:  Orthopedic Surgery   Contact information:   [address] [place] [address] [phone]       Signed: Patrica Duel 10/29/2012, 7:49 AM

## 2012-10-29 NOTE — Progress Notes (Signed)
   Subjective: 3 Days Post-Op Procedure(s) (LRB): RIGHT TOTAL KNEE ARTHROPLASTY (Right) Patient reports pain as mild.   Patient seen in rounds by Dr. Lequita Halt. Patient is well, but has had some minor complaints of pain in the knee, requiring pain medications Patient is ready to go to the SNF.   Objective: Vital signs in last 24 hours: Temp:  [98.3 F (36.8 C)-99.1 F (37.3 C)] 98.3 F (36.8 C) (10/09 0500) Pulse Rate:  [69-104] 104 (10/09 0500) Resp:  [16] 16 (10/09 0500) BP: (114-155)/(57-78) 143/76 mmHg (10/09 0500) SpO2:  [94 %-99 %] 98 % (10/09 0500)  Intake/Output from previous day:  Intake/Output Summary (Last 24 hours) at 10/29/12 0723 Last data filed at 10/28/12 1500  Gross per 24 hour  Intake    240 ml  Output   1050 ml  Net   -810 ml    Intake/Output this shift:    Labs:  Recent Labs  10/27/12 0403 10/28/12 0410 10/29/12 0426  HGB 7.8* 9.9* 9.8*    Recent Labs  10/28/12 0410 10/29/12 0426  WBC 10.2 10.1  RBC 3.48* 3.37*  HCT 30.1* 29.1*  PLT 229 216    Recent Labs  10/27/12 0403 10/28/12 0410  NA 135 138  K 4.8 4.1  CL 104 104  CO2 21 24  BUN 19 23  CREATININE 1.49* 1.49*  GLUCOSE 142* 135*  CALCIUM 8.9 9.7   No results found for this basename: LABPT, INR,  in the last 72 hours  EXAM: General - Patient is Alert, Appropriate and Oriented Extremity - Neurovascular intact Sensation intact distally Dorsiflexion/Plantar flexion intact Incision - clean, dry, no drainage, healing Motor Function - intact, moving foot and toes well on exam.   Assessment/Plan: 3 Days Post-Op Procedure(s) (LRB): RIGHT TOTAL KNEE ARTHROPLASTY (Right) Procedure(s) (LRB): RIGHT TOTAL KNEE ARTHROPLASTY (Right) Past Medical History  Diagnosis Date  . GOITER, MULTINODULAR 12/07/2009  . VITAMIN B12 DEFICIENCY 11/09/2009  . DM 11/09/2009  . ASTHMA 11/09/2009  . Hyperlipidemia   . Hypertension   . Anxiety   . Colon polyps 2009  . Pernicious anemia    B12 injections by PCP IN SEPT  . GERD (gastroesophageal reflux disease)   . Arthritis     BOTH KNEES - RIGHT KNEE PAIN WORSE   Principal Problem:   OA (osteoarthritis) of knee  Estimated body mass index is 33.45 kg/(m^2) as calculated from the following:   Height as of this encounter: 5\' 5"  (1.651 m).   Weight as of this encounter: 91.173 kg (201 lb). Up with therapy Discharge to SNF Diet - Cardiac diet and Diabetic diet Follow up - in 2 weeks Activity - WBAT Disposition - Skilled nursing facility Condition Upon Discharge - Good D/C Meds - See DC Summary DVT Prophylaxis - Xarelto  PERKINS, ALEXZANDREW 10/29/2012, 7:23 AM

## 2012-10-29 NOTE — Progress Notes (Signed)
SNF has not yet received authorization from [facility]. NSG will notify pt / MD. CSW will continue to assist with d/c planning needs.  [name] LCSW [phone]

## 2012-10-30 LAB — GLUCOSE, CAPILLARY
Glucose-Capillary: 123 mg/dL — ABNORMAL HIGH (ref 70–99)
Glucose-Capillary: 143 mg/dL — ABNORMAL HIGH (ref 70–99)
Glucose-Capillary: 106 mg/dL — ABNORMAL HIGH (ref 70–99)

## 2012-10-30 NOTE — Progress Notes (Signed)
Physical Therapy Treatment Patient Details Name: Grace Cordova MRN: [id number] DOB: August 27, 1940 Today's Date: 10/30/2012 Time: 4098-1191 PT Time Calculation (min): 24 min  PT Assessment / Plan / Recommendation  History of Present Illness R TKAon 10/26/12   PT Comments   POD # 4 pm session.  Assisted pt out of recliner to amb to BR then amb limited distance in hallway before returning to bed.  Pt c/o increased fatigue and L LE swelling.  "My leg is so swallow, I can't lift it"   Follow Up Recommendations  SNF     Does the patient have the potential to tolerate intense rehabilitation     Barriers to Discharge        Equipment Recommendations       Recommendations for Other Services    Frequency 7X/week   Progress towards PT Goals Progress towards PT goals: Progressing toward goals  Plan      Precautions / Restrictions Precautions Precautions: Knee;Fall Required Braces or Orthoses: Knee Immobilizer - Right Knee Immobilizer - Right: Discontinue once straight leg raise with < 10 degree lag Restrictions Weight Bearing Restrictions: No    Pertinent Vitals/Pain C/o 8/10 pain meds requested    Mobility  Bed Mobility Bed Mobility: Sit to Supine Supine to Sit: 4: Min assist Sit to Supine: 3: Mod assist Details for Bed Mobility Assistance: assist back to bed  Transfers Transfers: Sit to Stand;Stand to Sit Sit to Stand: 4: Min assist;With upper extremity assist;From bed;From toilet Stand to Sit: 4: Min assist;With upper extremity assist;To chair/3-in-1;To toilet Details for Transfer Assistance: verbal cues for hand placement and R LE out in front, cues for UE use on bed to push up. Ambulation/Gait Ambulation/Gait Assistance: 4: Min assist;4: Min [employer] social research officer (Feet): 38 Feet Assistive device: Rolling walker Ambulation/Gait Assistance Details: still issues of pain control requiring increased time Gait Pattern: Step-to pattern;Antalgic;Decreased step length -  right;Decreased stance time - right;Step-through pattern Gait velocity: slow     PT Goals (current goals can now be found in the care plan section)    Visit Information  Last PT Received On: 10/30/12 History of Present Illness: R TKAon 10/26/12    Subjective Data      Cognition       Balance     End of Session PT - End of Session Equipment Utilized During Treatment: Gait belt Activity Tolerance: Patient limited by pain;Patient limited by fatigue Patient left: in bed;with call bell/phone within reach   Felecia Shelling  PTA [facility]  Acute  Rehab Pager      [phone]

## 2012-10-30 NOTE — Discharge Summary (Signed)
ADDENDUM - Physician Discharge Summary   Patient ID: Grace Cordova MRN: [id number] DOB/AGE: 05/07/1940 72 y.o.  Admit date: 10/26/2012 Discharge date: New Date of Discharge - 10/30/2012  Primary Diagnosis:  Osteoarthritis Right knee(s)  Admission Diagnoses:  Past Medical History  Diagnosis Date  . GOITER, MULTINODULAR 12/07/2009  . VITAMIN B12 DEFICIENCY 11/09/2009  . DM 11/09/2009  . ASTHMA 11/09/2009  . Hyperlipidemia   . Hypertension   . Anxiety   . Colon polyps 2009  . Pernicious anemia     B12 injections by PCP IN SEPT  . GERD (gastroesophageal reflux disease)   . Arthritis     BOTH KNEES - RIGHT KNEE PAIN WORSE   Discharge Diagnoses:   Principal Problem:   OA (osteoarthritis) of knee  Estimated body mass index is 33.45 kg/(m^2) as calculated from the following:   Height as of this encounter: 5\' 5"  (1.651 m).   Weight as of this encounter: 91.173 kg (201 lb).  Procedure:  Procedure(s) (LRB): RIGHT TOTAL KNEE ARTHROPLASTY (Right)   Consults: None  HPI: Grace Cordova is a 72 y.o. year old female with end stage OA of her right knee with progressively worsening pain and dysfunction. She has constant pain, with activity and at rest and significant functional deficits with difficulties even with ADLs. She has had extensive non-op management including analgesics, injections of cortisone, and home exercise program, but remains in significant pain with significant dysfunction.Radiographs show bone on bone arthritis medial and patellofemoral. She presents now for right Total Knee Arthroplasty.   Laboratory Data: Admission on 10/26/2012  Component Date Value Range Status  . Order Confirmation 10/26/2012 ORDER PROCESSED BY BLOOD BANK   Final  . Glucose-Capillary 10/26/2012 119* 70 - 99 mg/dL Final  . Comment 1 78/29/5621 Documented in Chart   Final  . Comment 2 10/26/2012 Notify RN   Final  . Glucose-Capillary 10/26/2012 130* 70 - 99 mg/dL Final  . Comment 1 30/86/5784  Documented in Chart   Final  . Comment 2 10/26/2012 Notify RN   Final  . WBC 10/27/2012 8.5  4.0 - 10.5 K/uL Final  . RBC 10/27/2012 2.76* 3.87 - 5.11 MIL/uL Final  . Hemoglobin 10/27/2012 7.8* 12.0 - 15.0 g/dL Final  . HCT 69/62/9528 24.1* 36.0 - 46.0 % Final  . MCV 10/27/2012 87.3  78.0 - 100.0 fL Final  . MCH 10/27/2012 28.3  26.0 - 34.0 pg Final  . MCHC 10/27/2012 32.4  30.0 - 36.0 g/dL Final  . RDW 41/32/4401 14.2  11.5 - 15.5 % Final  . Platelets 10/27/2012 254  150 - 400 K/uL Final  . Sodium 10/27/2012 135  135 - 145 mEq/L Final  . Potassium 10/27/2012 4.8  3.5 - 5.1 mEq/L Final  . Chloride 10/27/2012 104  96 - 112 mEq/L Final  . CO2 10/27/2012 21  19 - 32 mEq/L Final  . Glucose, Bld 10/27/2012 142* 70 - 99 mg/dL Final  . BUN 02/72/5366 19  6 - 23 mg/dL Final  . Creatinine, Ser 10/27/2012 1.49* 0.50 - 1.10 mg/dL Final  . Calcium 44/03/4740 8.9  8.4 - 10.5 mg/dL Final  . GFR calc non Af Amer 10/27/2012 34* >90 mL/min Final  . GFR calc Af Amer 10/27/2012 39* >90 mL/min Final   Comment: (NOTE)                          The eGFR has been calculated using the CKD EPI equation.  This calculation has not been validated in all clinical situations.                          eGFR's persistently <90 mL/min signify possible Chronic Kidney                          Disease.  . Glucose-Capillary 10/26/2012 143* 70 - 99 mg/dL Final  . Glucose-Capillary 10/26/2012 152* 70 - 99 mg/dL Final  . Glucose-Capillary 10/27/2012 126* 70 - 99 mg/dL Final  . Comment 1 09/81/1914 Notify RN   Final  . Comment 2 10/27/2012 Documented in Chart   Final  . Glucose-Capillary 10/27/2012 131* 70 - 99 mg/dL Final  . Comment 1 78/29/5621 Notify RN   Final  . Comment 2 10/27/2012 Documented in Chart   Final  . Order Confirmation 10/27/2012 ORDER PROCESSED BY BLOOD BANK   Final  . Glucose-Capillary 10/27/2012 157* 70 - 99 mg/dL Final  . Comment 1 30/86/5784 Notify RN   Final  . Comment 2  10/27/2012 Documented in Chart   Final  . WBC 10/28/2012 10.2  4.0 - 10.5 K/uL Final  . RBC 10/28/2012 3.48* 3.87 - 5.11 MIL/uL Final  . Hemoglobin 10/28/2012 9.9* 12.0 - 15.0 g/dL Final   Comment: DELTA CHECK NOTED                          POST TRANSFUSION SPECIMEN  . HCT 10/28/2012 30.1* 36.0 - 46.0 % Final  . MCV 10/28/2012 86.5  78.0 - 100.0 fL Final  . MCH 10/28/2012 28.4  26.0 - 34.0 pg Final  . MCHC 10/28/2012 32.9  30.0 - 36.0 g/dL Final  . RDW 69/62/9528 14.7  11.5 - 15.5 % Final  . Platelets 10/28/2012 229  150 - 400 K/uL Final  . Sodium 10/28/2012 138  135 - 145 mEq/L Final  . Potassium 10/28/2012 4.1  3.5 - 5.1 mEq/L Final  . Chloride 10/28/2012 104  96 - 112 mEq/L Final  . CO2 10/28/2012 24  19 - 32 mEq/L Final  . Glucose, Bld 10/28/2012 135* 70 - 99 mg/dL Final  . BUN 41/32/4401 23  6 - 23 mg/dL Final  . Creatinine, Ser 10/28/2012 1.49* 0.50 - 1.10 mg/dL Final  . Calcium 02/72/5366 9.7  8.4 - 10.5 mg/dL Final  . GFR calc non Af Amer 10/28/2012 34* >90 mL/min Final  . GFR calc Af Amer 10/28/2012 39* >90 mL/min Final   Comment: (NOTE)                          The eGFR has been calculated using the CKD EPI equation.                          This calculation has not been validated in all clinical situations.                          eGFR's persistently <90 mL/min signify possible Chronic Kidney                          Disease.  . Glucose-Capillary 10/28/2012 105* 70 - 99 mg/dL Final  . Comment 1 44/03/4740 Notify RN   Final  . Comment 2 10/28/2012 Documented in Chart  Final  . Glucose-Capillary 10/28/2012 104* 70 - 99 mg/dL Final  . Comment 1 16/10/9602 Notify RN   Final  . Comment 2 10/28/2012 Documented in Chart   Final  . WBC 10/29/2012 10.1  4.0 - 10.5 K/uL Final  . RBC 10/29/2012 3.37* 3.87 - 5.11 MIL/uL Final  . Hemoglobin 10/29/2012 9.8* 12.0 - 15.0 g/dL Final  . HCT 54/09/8117 29.1* 36.0 - 46.0 % Final  . MCV 10/29/2012 86.4  78.0 - 100.0 fL Final  . MCH  10/29/2012 29.1  26.0 - 34.0 pg Final  . MCHC 10/29/2012 33.7  30.0 - 36.0 g/dL Final  . RDW 14/78/2956 14.8  11.5 - 15.5 % Final  . Platelets 10/29/2012 216  150 - 400 K/uL Final  . Glucose-Capillary 10/28/2012 99  70 - 99 mg/dL Final  . Comment 1 21/30/8657 Notify RN   Final  . Comment 2 10/28/2012 Documented in Chart   Final  . Glucose-Capillary 10/28/2012 117* 70 - 99 mg/dL Final  . Comment 1 84/69/6295 Notify RN   Final  . Glucose-Capillary 10/29/2012 118* 70 - 99 mg/dL Final  . Comment 1 28/41/3244 Notify RN   Final  . Comment 2 10/29/2012 Documented in Chart   Final  . Glucose-Capillary 10/29/2012 128* 70 - 99 mg/dL Final  . Comment 1 01/23/7251 Notify RN   Final  . Comment 2 10/29/2012 Documented in Chart   Final  . Glucose-Capillary 10/29/2012 107* 70 - 99 mg/dL Final  . Comment 1 66/44/0347 Notify RN   Final  . Glucose-Capillary 10/29/2012 115* 70 - 99 mg/dL Final  . Comment 1 42/59/5638 Notify RN   Final  . Glucose-Capillary 10/30/2012 123* 70 - 99 mg/dL Final  Hospital Outpatient Visit on 10/20/2012  Component Date Value Range Status  . aPTT 10/20/2012 38* 24 - 37 seconds Final   Comment:                                 IF BASELINE aPTT IS ELEVATED,                          SUGGEST PATIENT RISK ASSESSMENT                          BE USED TO DETERMINE APPROPRIATE                          ANTICOAGULANT THERAPY.  . WBC 10/20/2012 6.2  4.0 - 10.5 K/uL Final  . RBC 10/20/2012 3.35* 3.87 - 5.11 MIL/uL Final  . Hemoglobin 10/20/2012 9.4* 12.0 - 15.0 g/dL Final  . HCT 75/64/3329 29.1* 36.0 - 46.0 % Final  . MCV 10/20/2012 86.9  78.0 - 100.0 fL Final  . MCH 10/20/2012 28.1  26.0 - 34.0 pg Final  . MCHC 10/20/2012 32.3  30.0 - 36.0 g/dL Final  . RDW 51/88/4166 14.2  11.5 - 15.5 % Final  . Platelets 10/20/2012 275  150 - 400 K/uL Final  . Sodium 10/20/2012 139  135 - 145 mEq/L Final  . Potassium 10/20/2012 4.5  3.5 - 5.1 mEq/L Final  . Chloride 10/20/2012 105  96 - 112 mEq/L  Final  . CO2 10/20/2012 23  19 - 32 mEq/L Final  . Glucose, Bld 10/20/2012 92  70 - 99 mg/dL Final  . BUN 07/21/1599 14  6 - 23 mg/dL Final  . Creatinine, Ser 10/20/2012 1.34* 0.50 - 1.10 mg/dL Final  . Calcium 08/65/7846 10.1  8.4 - 10.5 mg/dL Final  . Total Protein 10/20/2012 7.5  6.0 - 8.3 g/dL Final  . Albumin 96/29/5284 4.0  3.5 - 5.2 g/dL Final  . AST 13/24/4010 18  0 - 37 U/L Final  . ALT 10/20/2012 8  0 - 35 U/L Final  . Alkaline Phosphatase 10/20/2012 71  39 - 117 U/L Final  . Total Bilirubin 10/20/2012 0.2* 0.3 - 1.2 mg/dL Final  . GFR calc non Af Amer 10/20/2012 39* >90 mL/min Final  . GFR calc Af Amer 10/20/2012 45* >90 mL/min Final   Comment: (NOTE)                          The eGFR has been calculated using the CKD EPI equation.                          This calculation has not been validated in all clinical situations.                          eGFR's persistently <90 mL/min signify possible Chronic Kidney                          Disease.  Marland Kitchen Prothrombin Time 10/20/2012 12.1  11.6 - 15.2 seconds Final  . INR 10/20/2012 0.91  0.00 - 1.49 Final  . ABO/RH(D) 10/20/2012 A POS   Final  . Antibody Screen 10/20/2012 NEG   Final  . Sample Expiration 10/20/2012 10/29/2012   Final  . Unit Number 10/20/2012 [id number]   Final  . Blood Component Type 10/20/2012 RED CELLS,LR   Final  . Unit division 10/20/2012 00   Final  . Status of Unit 10/20/2012 ISSUED,FINAL   Final  . Transfusion Status 10/20/2012 OK TO TRANSFUSE   Final  . Crossmatch Result 10/20/2012 Compatible   Final  . Unit Number 10/20/2012 [id number]   Final  . Blood Component Type 10/20/2012 RED CELLS,LR   Final  . Unit division 10/20/2012 00   Final  . Status of Unit 10/20/2012 ISSUED,FINAL   Final  . Transfusion Status 10/20/2012 OK TO TRANSFUSE   Final  . Crossmatch Result 10/20/2012 Compatible   Final  . Color, Urine 10/20/2012 YELLOW  YELLOW Final  . APPearance 10/20/2012 CLEAR  CLEAR Final  . Specific  Gravity, Urine 10/20/2012 1.013  1.005 - 1.030 Final  . pH 10/20/2012 6.0  5.0 - 8.0 Final  . Glucose, UA 10/20/2012 NEGATIVE  NEGATIVE mg/dL Final  . Hgb urine dipstick 10/20/2012 NEGATIVE  NEGATIVE Final  . Bilirubin Urine 10/20/2012 NEGATIVE  NEGATIVE Final  . Ketones, ur 10/20/2012 NEGATIVE  NEGATIVE mg/dL Final  . Protein, ur 32/95/1884 NEGATIVE  NEGATIVE mg/dL Final  . Urobilinogen, UA 10/20/2012 0.2  0.0 - 1.0 mg/dL Final  . Nitrite 16/60/6301 NEGATIVE  NEGATIVE Final  . Leukocytes, UA 10/20/2012 NEGATIVE  NEGATIVE Final   MICROSCOPIC NOT DONE ON URINES WITH NEGATIVE PROTEIN, BLOOD, LEUKOCYTES, NITRITE, OR GLUCOSE <1000 mg/dL.  Marland Kitchen MRSA, PCR 10/20/2012 NEGATIVE  NEGATIVE Final  . Staphylococcus aureus 10/20/2012 NEGATIVE  NEGATIVE Final   Comment:  The Xpert SA Assay (FDA                          approved for NASAL specimens                          in patients over 27 years of age),                          is one component of                          a comprehensive surveillance                          program.  Test performance has                          been validated by [employer] for patients greater                          than or equal to 67 year old.                          It is not intended                          to diagnose infection nor to                          guide or monitor treatment.  . ABO/RH(D) 10/20/2012 A POS   Final     X-Rays:Dg Chest 2 View  10/20/2012   CLINICAL DATA:  Pre operative respiratory exam. Osteoarthritis of the knee.  EXAM: CHEST  2 VIEW  COMPARISON:  06/13/2011  FINDINGS: The trachea is slightly deviated to the right at the level of the thoracic inlet. This is unchanged. The patient does have a history of multinodular goiter.  Heart size and pulmonary vascularity are normal and the lungs are clear. No acute osseous abnormality.  IMPRESSION: No active cardiopulmonary disease.  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   Electronically Signed   By: Geanie Cooley   On: 10/20/2012 12:48   Nm Myocar Single W/spect W/wall Motion And Ef  10/06/2012   Ordering Physician: Dietrich Pates  Reading Physician: Jonelle Sidle  Clinical Data: 72 year old woman with history of hypertension, hyperlipidemia, presenting with exertional fatigue.  The study is requested to evaluate for the presence of ischemia.  NUCLEAR MEDICINE STRESS MYOVIEW STUDY WITH SPECT AND LEFT VENTRICULAR EJECTION FRACTION  Radionuclide Data: One-day rest/stress protocol performed with 10/30 mCi of Tc-53m Myoview.  Stress Data: The patient exercised on a Bruce protocol for 5 minutes and 59 seconds achieving a maximum workload of seven METS. No chest pain was reported.  Peak heart rate was 148 beats per minute, 100% maximum age predicted heart rate response.  Peak blood pressure 193/66.  Lead motion artifact noted.  No clearly diagnostic ST-segment abnormalities in this setting.  Rare PVCs.  EKG: Baseline tracing  shows normal sinus rhythm at 63 beats per minute.  Scintigraphic Data: Analysis of the raw perfusion data finds breast attenuation artifact.  Tomographic views obtained using the short axis, vertical long axis, and horizontal long axis planes.  There is a small, mild to moderate intensity, fixed anteroseptal defect from mid to apical region.  This is most consistent with attenuation in light of normal wall motion in this region.  No definite ischemic defects.  Gated imaging reveals an EDV of 76, ESV of 18, T I D ratio of 0.72, and LVEF of 76% without wall motion abnormality.  IMPRESSION: Low risk exercise Myoview.  No diagnostic ST-segment abnormalities in the setting of lead motion artifact.  Hypertensive response noted.  No chest pain reported.  Perfusion imaging is consistent with breast attenuation, no definite scar or ischemia.  LVEF 76%.   Original Report Authenticated  By: Nona Dell    EKG: Orders placed in visit on 07/20/12  . EKG 12-LEAD     Hospital Course: Grace Cordova is a 72 y.o. who was admitted to [facility]. They were brought to the operating room on 10/26/2012 and underwent Procedure(s):  RIGHT TOTAL KNEE ARTHROPLASTY. Patient tolerated the procedure well and was later transferred to the recovery room and then to the orthopaedic floor for postoperative care. They were given PO and IV analgesics for pain control following their surgery. They were given 24 hours of postoperative antibiotics of  Anti-infectives    Start    Dose/Rate  Route  Frequency  Ordered  Stop    10/27/12 0100   vancomycin (VANCOCIN) IVPB 1000 mg/200 mL premix  1,000 mg  200 mL/hr over 60 Minutes  Intravenous  Every 12 hours  10/26/12 1718  10/27/12 0153    10/26/12 1050   vancomycin (VANCOCIN) IVPB 1000 mg/200 mL premix  1,000 mg  200 mL/hr over 60 Minutes  Intravenous  On call to O.R.  10/26/12 1050  10/26/12 1310     and started on DVT prophylaxis in the form of Xarelto. PT and OT were ordered for total joint protocol. Discharge planning consulted to help with postop disposition and equipment needs. Patient had a tough night on the evening of surgery but was feeling a little better the next morning. They started to get up OOB with therapy on day one. Hemovac drain was pulled without difficulty. Continued to work with therapy into day two. Dressing was changed on day two and the incision was healing well. By day three, the patient had progressed with therapy and meeting their goals. Incision was healing well. Patient was seen in rounds and was ready to go to the SNF - [facility].  ADDENDUM - The patient did not receive insurance approval on POD 3, 10/29/2012, for transfer to [facility] therefore she remained in house at [facility].  She was seen again on morning rounds by Dr. Lequita Halt on Friday 10/30/2012 and felt that she was a good candidate for  SNF and felt that she would be ready today when approval comes thru from the insurance.  She needs to go to a skilled facility. She has no family in the state and even though she is working with therapy, she is not independent to be at home by herself. We feel that she is not safe to be at that level of independence yet at this time and poses some risk at home by herself. Hopefully, she will not need a very long stay at [facility]  but definitely would benefit from it at this time. Will continue to work with therapy today and continue to plan on transfer to a SNF. Will tentatively setup arrangements for possible transfer alter today.    Discharge Medications: Prior to Admission medications   Medication Sig Start Date End Date Taking? Authorizing Provider  albuterol (PROAIR HFA) 108 (90 BASE) MCG/ACT inhaler Inhale 2 puffs into the lungs every 6 (six) hours as needed for wheezing. 06/19/12  Yes Storm Frisk, MD  hydrOXYzine (VISTARIL) 25 MG capsule Take 1 capsule by mouth daily. TAKES AT BEDTIME - HELPS ANXIETY AND ALLERGIES   Yes Historical Provider, MD  losartan (COZAAR) 25 MG tablet Take 25 mg by mouth every evening.    Yes Historical Provider, MD  metFORMIN (GLUCOPHAGE-XR) 500 MG 24 hr tablet Take 1 tablet (500 mg total) by mouth daily with breakfast. 10/09/12 10/09/13 Yes Romero Belling, MD  mometasone [facility]) 220 MCG/INH inhaler Inhale 2 puffs into the lungs daily. AT BEDTIME   Yes Historical Provider, MD  omeprazole (PRILOSEC) 20 MG capsule Take 1 capsule (20 mg total) by mouth daily. 02/06/12  Yes Storm Frisk, MD  simvastatin (ZOCOR) 40 MG tablet Take 40 mg by mouth every evening.    Yes Historical Provider, MD  acetaminophen (TYLENOL) 325 MG tablet Take 2 tablets (650 mg total) by mouth every 4 (four) hours as needed. 10/29/12   Alexzandrew Perkins, PA-C  bisacodyl (DULCOLAX) 10 MG suppository Place 1 suppository (10 mg total) rectally daily as needed. 10/29/12   Alexzandrew Perkins, PA-C  docusate  sodium 100 MG CAPS Take 100 mg by mouth 2 (two) times daily. 10/29/12   Alexzandrew Perkins, PA-C  ferrous fumarate (HEMOCYTE - 106 MG FE) 325 (106 FE) MG TABS tablet Take 1 tablet by mouth daily.    Historical Provider, MD  methocarbamol (ROBAXIN) 500 MG tablet Take 1 tablet (500 mg total) by mouth every 6 (six) hours as needed. 10/29/12   Alexzandrew Julien Girt, PA-C  metoCLOPramide (REGLAN) 5 MG tablet Take 1-2 tablets (5-10 mg total) by mouth every 8 (eight) hours as needed (if ondansetron (ZOFRAN) ineffective.). 10/29/12   Alexzandrew Perkins, PA-C  ondansetron (ZOFRAN) 4 MG tablet Take 1 tablet (4 mg total) by mouth every 6 (six) hours as needed for nausea. 10/29/12   Alexzandrew Perkins, PA-C  oxyCODONE (OXY IR/ROXICODONE) 5 MG immediate release tablet Take 1-2 tablets (5-10 mg total) by mouth every 3 (three) hours as needed. 10/29/12   Alexzandrew Perkins, PA-C  polyethylene glycol (MIRALAX / GLYCOLAX) packet Take 17 g by mouth daily as needed. 10/29/12   Alexzandrew Julien Girt, PA-C  rivaroxaban (XARELTO) 10 MG TABS tablet Take 1 tablet (10 mg total) by mouth daily with breakfast. Take Xarelto for two and a half more weeks, then discontinue Xarelto. Once the patient has completed the blood thinner regimen, then take a Baby 81 mg Aspirin daily for four more weeks. Once the patient has completed the Xarelto, they may resume their Mobic. 10/29/12   Alexzandrew Perkins, PA-C  temazepam (RESTORIL) 15 MG capsule Take 1 capsule (15 mg total) by mouth at bedtime as needed for sleep. 10/29/12   Alexzandrew Perkins, PA-C  traMADol (ULTRAM) 50 MG tablet Take 1-2 tablets (50-100 mg total) by mouth every 6 (six) hours as needed (mild pain). 10/29/12   Alexzandrew Julien Girt, PA-C   Discharge to SNF when approved by insurance company.  Diet - Cardiac diet and Diabetic diet  Follow up - in 2 weeks  Activity - WBAT  Disposition - Skilled nursing facility - [facility] Condition Upon Discharge - Good  D/C Meds -  See DC Summary  DVT Prophylaxis - Xarelto   Discharge Orders   Future Orders Complete By Expires   Call MD / Call 911  As directed    Comments:     If you experience chest pain or shortness of breath, CALL 911 and be transported to the hospital emergency room.  If you develope a fever above 101 F, pus (white drainage) or increased drainage or redness at the wound, or calf pain, call your surgeon's office.   Change dressing  As directed    Comments:     Change dressing daily with sterile 4 x 4 inch gauze dressing and apply TED hose. Do not submerge the incision under water.   Constipation Prevention  As directed    Comments:     Drink plenty of fluids.  Prune juice may be helpful.  You may use a stool softener, such as Colace (over the counter) 100 mg twice a day.  Use MiraLax (over the counter) for constipation as needed.   Diet - low sodium heart healthy  As directed    Diet Carb Modified  As directed    Discharge instructions  As directed    Comments:     Pick up stool softner and laxative for home. Do not submerge incision under water. May shower. Continue to use ice for pain and swelling from surgery.  Take Xarelto for two and a half more weeks, then discontinue Xarelto. Once the patient has completed the blood thinner regimen, then take a Baby 81 mg Aspirin daily for four more weeks.  When discharged from the skilled rehab facility, please have the facility set up the patient's Home Health Physical Therapy prior to being released.  Also provide the patient with their medications at time of release from the facility to include their pain medication, the muscle relaxants, and their blood thinner medication.  If the patient is still at the rehab facility at time of follow up appointment, please also assist the patient in arranging follow up appointment in our office and any transportation needs.   Do not put a pillow under the knee. Place it under the heel.  As directed    Do not sit  on low chairs, stoools or toilet seats, as it may be difficult to get up from low surfaces  As directed    Driving restrictions  As directed    Comments:     No driving until released by the physician.   Increase activity slowly as tolerated  As directed    Lifting restrictions  As directed    Comments:     No lifting until released by the physician.   Patient may shower  As directed    Comments:     You may shower without a dressing once there is no drainage.  Do not wash over the wound.  If drainage remains, do not shower until drainage stops.   TED hose  As directed    Comments:     Use stockings (TED hose) for 3 weeks on both leg(s).  You may remove them at night for sleeping.   Weight bearing as tolerated  As directed    Questions:     Laterality:     Extremity:         Medication List    STOP taking these medications       meloxicam  15 MG tablet  Commonly known as:  MOBIC      TAKE these medications       acetaminophen 325 MG tablet  Commonly known as:  TYLENOL  Take 2 tablets (650 mg total) by mouth every 4 (four) hours as needed.     albuterol 108 (90 BASE) MCG/ACT inhaler  Commonly known as:  PROAIR HFA  Inhale 2 puffs into the lungs every 6 (six) hours as needed for wheezing.     bisacodyl 10 MG suppository  Commonly known as:  DULCOLAX  Place 1 suppository (10 mg total) rectally daily as needed.     DSS 100 MG Caps  Take 100 mg by mouth 2 (two) times daily.     ferrous fumarate 325 (106 FE) MG Tabs tablet  Commonly known as:  HEMOCYTE - 106 mg FE  Take 1 tablet by mouth daily.     hydrOXYzine 25 MG capsule  Commonly known as:  VISTARIL  Take 1 capsule by mouth daily. TAKES AT BEDTIME - HELPS ANXIETY AND ALLERGIES     losartan 25 MG tablet  Commonly known as:  COZAAR  Take 25 mg by mouth every evening.     metFORMIN 500 MG 24 hr tablet  Commonly known as:  GLUCOPHAGE-XR  Take 1 tablet (500 mg total) by mouth daily with breakfast.      methocarbamol 500 MG tablet  Commonly known as:  ROBAXIN  Take 1 tablet (500 mg total) by mouth every 6 (six) hours as needed.     metoCLOPramide 5 MG tablet  Commonly known as:  REGLAN  Take 1-2 tablets (5-10 mg total) by mouth every 8 (eight) hours as needed (if ondansetron (ZOFRAN) ineffective.).     mometasone 220 MCG/INH inhaler  Commonly known as:  ASMANEX  Inhale 2 puffs into the lungs daily. AT BEDTIME     omeprazole 20 MG capsule  Commonly known as:  PRILOSEC  Take 1 capsule (20 mg total) by mouth daily.     ondansetron 4 MG tablet  Commonly known as:  ZOFRAN  Take 1 tablet (4 mg total) by mouth every 6 (six) hours as needed for nausea.     oxyCODONE 5 MG immediate release tablet  Commonly known as:  Oxy IR/ROXICODONE  Take 1-2 tablets (5-10 mg total) by mouth every 3 (three) hours as needed.     polyethylene glycol packet  Commonly known as:  MIRALAX / GLYCOLAX  Take 17 g by mouth daily as needed.     rivaroxaban 10 MG Tabs tablet  Commonly known as:  XARELTO  - Take 1 tablet (10 mg total) by mouth daily with breakfast. Take Xarelto for two and a half more weeks, then discontinue Xarelto.  - Once the patient has completed the blood thinner regimen, then take a Baby 81 mg Aspirin daily for four more weeks.  - Once the patient has completed the Xarelto, they may resume their Mobic.     simvastatin 40 MG tablet  Commonly known as:  ZOCOR  Take 40 mg by mouth every evening.     temazepam 15 MG capsule  Commonly known as:  RESTORIL  Take 1 capsule (15 mg total) by mouth at bedtime as needed for sleep.     traMADol 50 MG tablet  Commonly known as:  ULTRAM  Take 1-2 tablets (50-100 mg total) by mouth every 6 (six) hours as needed (mild pain).           Follow-up Information  Follow up with Loanne Drilling, MD. Schedule an appointment as soon as possible for a visit in 2 weeks.   Specialty:  Orthopedic Surgery   Contact information:   [address] [place] [address] [phone]       Signed: Patrica Duel 10/30/2012, 8:44 AM

## 2012-10-30 NOTE — Progress Notes (Signed)
CSW has made several calls today to [facility] to check status of BCBS authorization. Awaiting return call from SNF. Pt is aware that BCBS may not authorize SNF placement and is considering other options for support at home.  [name] LCSW [phone]

## 2012-10-30 NOTE — Progress Notes (Signed)
Physical Therapy Treatment Patient Details Name: Grace Cordova MRN: [id number] DOB: [birth year] Today's Date: 10/30/2012 Time: 8295-6213 PT Time Calculation (min): 27 min  PT Assessment / Plan / Recommendation  History of Present Illness R TKAon 10/26/12   PT Comments   POD #4 pt c/o increased pain.  Assisted OOB to amb to BR then limited amb distance in hallway due to increased c/o pain today.  Pt progressing slowly and remains unsteady with her gait.  Pt will need ST Rehab at SNF.   Follow Up Recommendations  SNF     Does the patient have the potential to tolerate intense rehabilitation     Barriers to Discharge        Equipment Recommendations       Recommendations for Other Services    Frequency 7X/week   Progress towards PT Goals Progress towards PT goals: Progressing toward goals  Plan      Precautions / Restrictions Precautions Precautions: Knee;Fall Required Braces or Orthoses: Knee Immobilizer - Right Knee Immobilizer - Right: Discontinue once straight leg raise with < 10 degree lag Restrictions Weight Bearing Restrictions: No    Pertinent Vitals/Pain C/o 8/10 knee pain    Mobility  Bed Mobility Bed Mobility: Supine to Sit Supine to Sit: 4: Min assist Details for Bed Mobility Assistance: assist for R LE support  to lower to floor., multimodal cues to position on bed. Transfers Transfers: Sit to Stand;Stand to Sit Sit to Stand: 4: Min assist;With upper extremity assist;From bed;From toilet Stand to Sit: 4: Min assist;With upper extremity assist;To chair/3-in-1;To toilet Details for Transfer Assistance: verbal cues for hand placement and R LE out in front, cues for UE use on bed to push up. Ambulation/Gait Ambulation/Gait Assistance: 4: Min assist;4: Min Government social research officer (Feet): 42 Feet Assistive device: Rolling walker Ambulation/Gait Assistance Details: decreased amb distance 2nd increased c/o pain.  Difficulty advancing R LE and decreased WB  tolerance this session.  Gait Pattern: Step-to pattern;Antalgic;Decreased step length - right;Decreased stance time - right;Step-through pattern Gait velocity: slow     PT Goals (current goals can now be found in the care plan section)    Visit Information  Last PT Received On: 10/30/12 History of Present Illness: R TKAon 10/26/12    Subjective Data      Cognition       Balance     End of Session PT - End of Session Equipment Utilized During Treatment: Gait belt Activity Tolerance: Patient tolerated treatment well Patient left: with call bell/phone within reach;in chair   Felecia Shelling  PTA [facility]  Acute  Rehab Pager      [phone]

## 2012-10-30 NOTE — Progress Notes (Signed)
Clinical Social Work  CSW spoke with SNF who reports no response from [facility] but that admissions coordinator will continue to call insurance for authorization. SNF reports if they get a response then they will call weekend CSW with number. CSW informed patient and RN of plans.   Unk Lightning, LCSW (Coverage for [employer])

## 2012-10-30 NOTE — Progress Notes (Addendum)
Subjective: 4 Days Post-Op Procedure(s) (LRB): RIGHT TOTAL KNEE ARTHROPLASTY (Right) Patient reports pain as mild.   Patient seen in rounds with Dr. Lequita Halt. Patient is well, but has had some minor complaints of pain in the knee, requiring pain medications Patient is ready to go to the SNF.  Patient remained in house last night since we didn't receive confirmation of approval from the insurance company.  Informed that they will review the case again today and plan to make a decision.  She needs to go to a skilled facility.  She has no family in the state and even though she is working with therapy, she is not independent to be at home by herself.  We feel that she is not safe to be at that level of independence yet at this time and poses some risk at home by herself.  Hopefully, she will not need a very long stay at SNF but definitely would benefit from it at this time.  Will continue to work with therapy today and continue to plan on transfer to a SNF.  Will tentatively setup arrangements for possible transfer alter today.   Objective: Vital signs in last 24 hours: Temp:  [97.6 F (36.4 C)-99.7 F (37.6 C)] 99.7 F (37.6 C) (10/10 0608) Pulse Rate:  [72-87] 81 (10/10 0608) Resp:  [16-18] 18 (10/10 0608) BP: (109-151)/(51-76) 109/51 mmHg (10/10 0608) SpO2:  [95 %-100 %] 98 % (10/10 0608)  Intake/Output from previous day:  Intake/Output Summary (Last 24 hours) at 10/30/12 0838 Last data filed at 10/29/12 1500  Gross per 24 hour  Intake    600 ml  Output      0 ml  Net    600 ml    Intake/Output this shift:    Labs:  Recent Labs  10/28/12 0410 10/29/12 0426  HGB 9.9* 9.8*    Recent Labs  10/28/12 0410 10/29/12 0426  WBC 10.2 10.1  RBC 3.48* 3.37*  HCT 30.1* 29.1*  PLT 229 216    Recent Labs  10/28/12 0410  NA 138  K 4.1  CL 104  CO2 24  BUN 23  CREATININE 1.49*  GLUCOSE 135*  CALCIUM 9.7   No results found for this basename: LABPT, INR,  in the last 72  hours  EXAM: General - Patient is Alert, Appropriate and Oriented Extremity - Neurovascular intact Sensation intact distally Dorsiflexion/Plantar flexion intact Incision - clean, dry, no drainage, healing Motor Function - intact, moving foot and toes well on exam.   Assessment/Plan: 4 Days Post-Op Procedure(s) (LRB): RIGHT TOTAL KNEE ARTHROPLASTY (Right) Procedure(s) (LRB): RIGHT TOTAL KNEE ARTHROPLASTY (Right) Past Medical History  Diagnosis Date  . GOITER, MULTINODULAR 12/07/2009  . VITAMIN B12 DEFICIENCY 11/09/2009  . DM 11/09/2009  . ASTHMA 11/09/2009  . Hyperlipidemia   . Hypertension   . Anxiety   . Colon polyps 2009  . Pernicious anemia     B12 injections by PCP IN SEPT  . GERD (gastroesophageal reflux disease)   . Arthritis     BOTH KNEES - RIGHT KNEE PAIN WORSE   Principal Problem:   OA (osteoarthritis) of knee  Estimated body mass index is 33.45 kg/(m^2) as calculated from the following:   Height as of this encounter: 5\' 5"  (1.651 m).   Weight as of this encounter: 91.173 kg (201 lb). Up with therapy Discharge to SNF when approved by insurance company. Diet - Cardiac diet and Diabetic diet  Follow up - in 2 weeks  Activity -  WBAT  Disposition - Skilled nursing facility  Condition Upon Discharge - Good  D/C Meds - See DC Summary  DVT Prophylaxis - Xarelto  PERKINS, ALEXZANDREW 10/30/2012, 8:38 AM

## 2012-10-30 NOTE — Progress Notes (Signed)
Clinical Social Work  CSW spoke with [facility] who reports a pre-authorization number was provided by insurance but that patient cannot be admitted with this number. Admissions coordinator is going to call insurance company to check status of authorization and will call CSW with determination. CSW will continue to follow.  Unk Lightning, LCSW (Coverage for [employer])

## 2012-10-31 NOTE — Progress Notes (Signed)
CSW received a call from [employer] for Social Work and he stated that [employer] auth was received and that someone from [facility] would be contacting CSW for d/c planning. CSW was instructed to contact Eunice Blase [phone] for assistance with d/c if they have not contacted CSW by 12 noon.   CSW to follow for d/c planning.   Leron Croak, LCSWA [facility] Emergency [facility].  [phone]

## 2012-10-31 NOTE — Progress Notes (Signed)
Pt to be d/c today to [facility].   Pt and family agreeable. Confirmed plans with facility.  Plan transfer via EMS.   Leron Croak, LCSWA [facility] Emergency [facility].  [phone]

## 2012-10-31 NOTE — Progress Notes (Signed)
Pt discharged to rehab. Left unit on stretcher pushed by ambulance personnel. Left in good condition. Pt premedicated for transport. Vwilliams,rn.

## 2012-10-31 NOTE — Progress Notes (Signed)
Clinical Social Work Department CLINICAL SOCIAL WORK PLACEMENT NOTE 10/31/2012  Patient:  Grace Cordova, Grace Cordova  Account Number:  [id number] Admit date:  10/26/2012  Clinical Social Worker:  [name], LCSW  Date/time:  10/27/2012 12:14 PM  Clinical Social Work is seeking post-discharge placement for this patient at the following level of care:   SKILLED NURSING   (*CSW will update this form in Epic as items are completed)   10/27/2012  Patient/family provided with [facility] Department of Clinical Social Work's list of facilities offering this level of care within the geographic area requested by the patient (or if unable, by the patient's family).  10/27/2012  Patient/family informed of their freedom to choose among providers that offer the needed level of care, that participate in Medicare, Medicaid or managed care program needed by the patient, have an available bed and are willing to accept the patient.  10/27/2012  Patient/family informed of [facility]' ownership interest in [facility], as well as of the fact that they are under no obligation to receive care at this facility.  PASARR submitted to EDS on 10/27/2012 PASARR number received from EDS on 10/27/2012  FL2 transmitted to all facilities in geographic area requested by pt/family on  10/27/2012 FL2 transmitted to all facilities within larger geographic area on   Patient informed that his/her managed care company has contracts with or will negotiate with  certain facilities, including the following:     Patient/family informed of bed offers received:  10/27/2012 Patient chooses bed at [facility] Physician recommends and patient chooses bed at    Patient to be transferred to [facility] on  10/31/2012 Patient to be transferred to facility by   The following physician request were entered in Epic:   Additional Comments: BCBS provided authorization for SNF placement.   [name]  LCSW [phone]

## 2012-10-31 NOTE — Progress Notes (Signed)
Report called and given to [facility] at [facility]. All questions answered appropriately. Pt awaiting transport to facility. Grace Cordova

## 2012-10-31 NOTE — Progress Notes (Signed)
   Subjective: 5 Days Post-Op Procedure(s) (LRB): RIGHT TOTAL KNEE ARTHROPLASTY (Right)   Patient reports pain as mild, pain controlled. No events throughout the night. If authorized, plan to d/c to SNF today.  Objective:   VITALS:   Filed Vitals:   10/31/12  BP: 104/55  Pulse: 91  Temp: 99.3 F (37.4 C)   Resp: 18    Neurovascular intact Dorsiflexion/Plantar flexion intact Incision: dressing C/D/I No cellulitis present Compartment soft  LABS  Recent Labs  10/29/12 0426  HGB 9.8*  HCT 29.1*  WBC 10.1  PLT 216     Assessment/Plan: 5 Days Post-Op Procedure(s) (LRB): RIGHT TOTAL KNEE ARTHROPLASTY (Right)  Up with therapy Discharge to SNF if authorized. Follow up in 2 weeks at [facility]. Follow up with Dr. Lequita Halt in 2 weeks.  Contact information:  [facility] [address], [address] [place] [address] [phone]        Grace Cordova. Grace Cordova   PAC  10/31/2012, 9:48 AM

## 2012-11-02 NOTE — Progress Notes (Signed)
Discharge summary sent to payer through [employer]  

## 2012-11-19 ENCOUNTER — Ambulatory Visit (HOSPITAL_COMMUNITY)
Admission: RE | Admit: 2012-11-19 | Discharge: 2012-11-19 | Disposition: A | Discharge status: Home / Self Care | Payer: BC Managed Care – PPO | Source: Ambulatory Visit | Attending: Orthopedic Surgery | Admitting: Orthopedic Surgery

## 2012-11-19 DIAGNOSIS — E119 Type 2 diabetes mellitus without complications: Secondary | ICD-10-CM | POA: Insufficient documentation

## 2012-11-19 DIAGNOSIS — R262 Difficulty in walking, not elsewhere classified: Secondary | ICD-10-CM | POA: Insufficient documentation

## 2012-11-19 DIAGNOSIS — M25669 Stiffness of unspecified knee, not elsewhere classified: Secondary | ICD-10-CM | POA: Insufficient documentation

## 2012-11-19 DIAGNOSIS — M25569 Pain in unspecified knee: Secondary | ICD-10-CM | POA: Insufficient documentation

## 2012-11-19 DIAGNOSIS — Z96651 Presence of right artificial knee joint: Secondary | ICD-10-CM | POA: Insufficient documentation

## 2012-11-19 DIAGNOSIS — M25661 Stiffness of right knee, not elsewhere classified: Secondary | ICD-10-CM | POA: Insufficient documentation

## 2012-11-19 DIAGNOSIS — IMO0001 Reserved for inherently not codable concepts without codable children: Secondary | ICD-10-CM | POA: Insufficient documentation

## 2012-11-19 DIAGNOSIS — M25561 Pain in right knee: Secondary | ICD-10-CM | POA: Insufficient documentation

## 2012-11-19 NOTE — Evaluation (Signed)
Physical Therapy Evaluation  Patient Details  Name: Grace Cordova MRN: [id number] Date of Birth: 10/11/1940  Today's Date: 11/19/2012 Time: 1355-1435 PT Time Calculation (min): 40 min Charges:  1 evaluation              Visit#: 1 of 12  Re-eval: 12/19/12 Assessment Diagnosis: Rt TKR Surgical Date: 10/26/12 Next MD Visit: Dr. Despina Hick -12/03/12 Prior Therapy: [facility]  Authorization: Secondary Medicare    Authorization Time Period:    Authorization Visit#: 1 of 10   Past Medical History:  Past Medical History  Diagnosis Date  . GOITER, MULTINODULAR 12/07/2009  . VITAMIN B12 DEFICIENCY 11/09/2009  . DM 11/09/2009  . ASTHMA 11/09/2009  . Hyperlipidemia   . Hypertension   . Anxiety   . Colon polyps 2009  . Pernicious anemia     B12 injections by PCP IN SEPT  . GERD (gastroesophageal reflux disease)   . Arthritis     BOTH KNEES - RIGHT KNEE PAIN WORSE   Past Surgical History:  Past Surgical History  Procedure Laterality Date  . Cesarean section    . Uterine fibroid surgery      x2  . Total knee arthroplasty Right 10/26/2012    Procedure: RIGHT TOTAL KNEE ARTHROPLASTY;  Surgeon: Loanne Drilling, MD;  Location: [facility];  Service: Orthopedics;  Laterality: Right;    Subjective Symptoms/Limitations Pertinent History: Ptis a 72 year old female referred to PT  s/p Rt TKR on 10/26/12.  her c/co are: difficulty walking independently, decreased knee AROM, unable to swim or participate in zumba activities, pain to the hamstring, hip to ankle on lateral side.  Did not take a joint education class before her TKR due to being scheudled during a cancelation.  Patient Stated Goals: Go back to zumba and swimming Pain Assessment Currently in Pain?: Yes Pain Score: 2  Pain Location: Knee Pain Orientation: Right Pain Type: Surgical pain;Acute pain Pain Onset: 1 to 4 weeks ago Pain Frequency: Constant Pain Relieving Factors: pain medication, ice  Precautions/Restrictions   Precautions Precautions: Knee;Fall  Balance Screening Balance Screen Has the patient fallen in the past 6 months: No Has the patient had a decrease in activity level because of a fear of falling? : Yes Is the patient reluctant to leave their home because of a fear of falling? : No  Prior Functioning  Prior Function Comments: enjoys swimming and zumba  Cognition/Observation Observation/Other Assessments Observations: moderate edema to Rt knee  Sensation/Coordination/Flexibility/Functional Tests Coordination Gross Motor Movements are Fluid and Coordinated: No Coordination and Movement Description: impaired to distal quadricep Functional Tests Functional Tests: FOTO: 28/71  RLE AROM (degrees) Right Knee Extension: 5 Right Knee Flexion: 70 RLE PROM (degrees) Right Knee Extension: 0 Right Knee Flexion: 75 RLE Strength Right Hip Flexion: 4/5 Right Hip Extension: 4/5 Right Hip ABduction: 5/5 Right Hip ADduction: 4/5 Right Knee Flexion: 3+/5 Right Knee Extension: 3+/5 Palpation Palpation: pain and tenderness to Rt lateral leg, hamstrings and popliteal region.  Significant fascial restrictions to scar.  Moderate pitting edema.   Mobility/Balance  Ambulation/Gait Ambulation/Gait: Yes Assistive device: Straight cane Gait Pattern: Step-to pattern;Antalgic;Decreased step length - right;Decreased stance time - right;Step-through pattern;Decreased hip/knee flexion - right;Right foot flat   Exercise/Treatments Standing Knee Flexion: Right;10 reps Functional Squat: 15 reps Seated Long Arc Quad: Right;10 reps Heel Slides: Right;10 reps Supine Quad Sets: Right;5 reps Short Arc Quad Sets: Right;5 reps Heel Slides: Right;5 reps Prone  Hamstring Curl: 5 reps;5 seconds;Limitations Hamstring Curl Limitations: AAROM Hip Extension:  Right;5 reps  Physical Therapy Assessment and Plan PT Assessment and Plan Clinical Impression Statement: Pt is a 72 year old female referred to PT  s/Rt TKR on 10/26/12 with impairments listed below.  Educated pt on scar mobilizations and patellar mobilizations and explained importance of knee extension and flexion during gait.  At this time pt is most limited by her knee AROM due to increased pain.  Pt will benefit from skilled therapeutic intervention in order to improve on the following deficits: Abnormal gait;Decreased coordination;Difficulty walking;Decreased strength;Impaired perceived functional ability;Decreased range of motion;Decreased balance;Decreased activity tolerance;Increased muscle spasms;Increased fascial restricitons;Improper body mechanics;Pain;Increased edema Rehab Potential: Good PT Frequency: Min 3X/week PT Duration: 8 weeks PT Treatment/Interventions: DME instruction;Gait training;Functional mobility training;Therapeutic exercise;Patient/family education;Stair training;Therapeutic activities;Balance training;Neuromuscular re-education;Manual techniques PT Plan: Focus on imporving knee A/PROM: heel slides, standing knee flexion, heel/toe raises.  Continue with manual techniques to decrease scar tissue improve patellar mobility and decrease edema    Goals Home Exercise Program Pt/caregiver will Perform Home Exercise Program: For increased ROM;For increased strengthening;Independently PT Goal: Perform Home Exercise Program - Progress: Goal set today PT Short Term Goals Time to Complete Short Term Goals: 3 weeks PT Short Term Goal 1: Pt will improve Rt knee AROM 0-110 degress for greater ease with gait mechanics.  PT Short Term Goal 2: Pt will present with minimal fascial restrictions and edema to report pain less than 5/10 with walking activities.  PT Short Term Goal 3: Pt will improve her strength by 1 muscle grade to ambulate with improved gait mechanics and begin to ambulate independently.  PT Long Term Goals Time to Complete Long Term Goals:  (6 weeks) PT Long Term Goal 1: Pt will improve her Rt knee AROM to [facility] in  order to ambulate independently with moderate gait impairments.  PT Long Term Goal 2: Pt will improve her LE functional strength in order to go from sit to stand without UE assistance.  Long Term Goal 3: Pt will improve her FOTO to status greater that 49% and limiataion less than 51% for improved QOL in order to return to zumba and swimming activities.   Problem List Patient Active Problem List   Diagnosis Date Noted  . Difficulty in walking(719.7) 11/19/2012  . Pain in right knee 11/19/2012  . Stiffness of right knee 11/19/2012  . Status post total right knee replacement 11/19/2012  . OA (osteoarthritis) of knee 10/26/2012  . Hypersomnia 06/20/2012  . GERD (gastroesophageal reflux disease) 12/02/2011  . GOITER, MULTINODULAR 12/07/2009  . DM 11/09/2009  . VITAMIN B12 DEFICIENCY 11/09/2009  . Cough variant asthma with atopic features 11/09/2009  . 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/06/2009    PT Plan of Care PT Home Exercise Plan: given PT Patient Instructions: importance of knee fleixon Consulted and Agree with Plan of Care: Patient  GP Functional Assessment Tool Used: FOTO: 28/72 Functional Limitation: Mobility: Walking and moving around Mobility: Walking and Moving Around Current Status (Z6109): At least 60 percent but less than 80 percent impaired, limited or restricted Mobility: Walking and Moving Around Goal Status [phone]): At least 60 percent but less than 80 percent impaired, limited or restricted  Marki Frede, MPT, ATC 11/19/2012, 4:28 PM  Physician Documentation Your signature is required to indicate approval of the treatment plan as stated above.  Please sign and either send electronically or make a copy of this report for your files and return this physician signed original.   Please mark one 1.__approve of plan  2. ___approve of plan  with the following conditions.   ______________________________                                                           _____________________ Physician Signature                                                                                                             Date

## 2012-11-23 ENCOUNTER — Ambulatory Visit (HOSPITAL_COMMUNITY)
Admission: RE | Admit: 2012-11-23 | Discharge: 2012-11-23 | Disposition: A | Discharge status: Home / Self Care | Payer: BC Managed Care – PPO | Source: Ambulatory Visit | Attending: Family Medicine | Admitting: Family Medicine

## 2012-11-23 DIAGNOSIS — M25569 Pain in unspecified knee: Secondary | ICD-10-CM | POA: Insufficient documentation

## 2012-11-23 DIAGNOSIS — M25661 Stiffness of right knee, not elsewhere classified: Secondary | ICD-10-CM

## 2012-11-23 DIAGNOSIS — E119 Type 2 diabetes mellitus without complications: Secondary | ICD-10-CM | POA: Insufficient documentation

## 2012-11-23 DIAGNOSIS — M25561 Pain in right knee: Secondary | ICD-10-CM

## 2012-11-23 DIAGNOSIS — R262 Difficulty in walking, not elsewhere classified: Secondary | ICD-10-CM | POA: Insufficient documentation

## 2012-11-23 DIAGNOSIS — IMO0001 Reserved for inherently not codable concepts without codable children: Secondary | ICD-10-CM | POA: Insufficient documentation

## 2012-11-23 DIAGNOSIS — M25669 Stiffness of unspecified knee, not elsewhere classified: Secondary | ICD-10-CM | POA: Insufficient documentation

## 2012-11-23 DIAGNOSIS — Z96651 Presence of right artificial knee joint: Secondary | ICD-10-CM

## 2012-11-23 NOTE — Progress Notes (Signed)
Physical Therapy Treatment Patient Details  Name: Grace Cordova MRN: [id number] Date of Birth: [birth year]  Today's Date: 11/23/2012 Time: 4098-1191 PT Time Calculation (min): 54 min Charge: TE 4782-9562, Gait 1424-1432, Estim/Ice [phone]  Visit#: 2 of 12  Re-eval: 12/19/12 Assessment Diagnosis: Rt TKR Surgical Date: 10/26/12 Next MD Visit: Dr. Despina Hick -12/03/12 Prior Therapy: HHPT  Authorization: Secondary Medicare  Authorization Time Period:    Authorization Visit#: 2 of 10   Subjective: Symptoms/Limitations Symptoms: Pt stated she has difficulty sleeping at night and has noticed new Lt side due to walking difficulty from knee ROM.   Pain Assessment Currently in Pain?: Yes Pain Score: 2  Pain Location: Knee Pain Orientation: Right  Precautions/Restrictions  Precautions Precautions: Knee;Fall  Exercise/Treatments Aerobic Isokinetic: Biodex PROM x 8 minutes 100% extension; flexion 93% Standing Heel Raises: 10 reps;Limitations Heel Raises Limitations: Toe raises  Knee Flexion: Right;10 reps Functional Squat: 15 reps Supine Quad Sets: Right;10 reps;Limitations Quad Sets Limitations: 5" holds Short Arc Quad Sets: Right;10 reps;Limitations Water quality scientist Limitations: 5" holds Terminal Knee Extension: AAROM;Right;10 reps;Limitations Terminal Knee Extension Limitations: 5" holds  Modalities Modalities: Cryotherapy;Electrical Stimulation Cryotherapy Number Minutes Cryotherapy: 10 Minutes Type of Cryotherapy: Ice pack Pharmacologist Location: Rt knee Statistician Action: Hi volt 220 volts with LE elevated with ice to reduce edema and pain Electrical Stimulation Parameters: HI volt negative polatiry 220 volts with LE elevated with ice Electrical Stimulation Goals: Pain;Edema  Physical Therapy Assessment and Plan PT Assessment and Plan Clinical Impression Statement: Session focus in improving ROM and improving gait  mechanics with LRAD/no AD.  Began biodex to improve passive ROM with abiltiy to extend knee 100% and 93% flexion.  Pt improved AROM to 4-76 degrees following cueing for correct musculature activation, tactile cueing for distal quadricep contraction.  Began hi volt estim to assist with edema control.   PT Plan: Focus on imporving knee A/PROM: heel slides, standing knee flexion, heel/toe raises.  Continue with manual techniques to decrease scar tissue improve patellar mobility and decrease edema    Goals Home Exercise Program Pt/caregiver will Perform Home Exercise Program: For increased ROM;For increased strengthening;Independently PT Short Term Goals Time to Complete Short Term Goals: 3 weeks PT Short Term Goal 1: Pt will improve Rt knee AROM 0-110 degress for greater ease with gait mechanics.  PT Short Term Goal 1 - Progress: Progressing toward goal PT Short Term Goal 2: Pt will present with minimal fascial restrictions and edema to report pain less than 5/10 with walking activities.  PT Short Term Goal 3: Pt will improve her strength by 1 muscle grade to ambulate with improved gait mechanics and begin to ambulate independently.  PT Short Term Goal 3 - Progress: Progressing toward goal PT Long Term Goals Time to Complete Long Term Goals:  (6 weeks) PT Long Term Goal 1: Pt will improve her Rt knee AROM to [facility] in order to ambulate independently with moderate gait impairments.  PT Long Term Goal 2: Pt will improve her LE functional strength in order to go from sit to stand without UE assistance.  Long Term Goal 3: Pt will improve her FOTO to status greater that 49% and limiataion less than 51% for improved QOL in order to return to zumba and swimming activities.   Problem List Patient Active Problem List   Diagnosis Date Noted  . Difficulty in walking(719.7) 11/19/2012  . Pain in right knee 11/19/2012  . Stiffness of right knee 11/19/2012  . Status post  total right knee replacement 11/19/2012   . OA (osteoarthritis) of knee 10/26/2012  . Hypersomnia 06/20/2012  . GERD (gastroesophageal reflux disease) 12/02/2011  . GOITER, MULTINODULAR 12/07/2009  . DM 11/09/2009  . VITAMIN B12 DEFICIENCY 11/09/2009  . Cough variant asthma with atopic features 11/09/2009  . 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/06/2009    PT - End of Session Activity Tolerance: Patient tolerated treatment well General Behavior During Therapy: [facility] for tasks assessed/performed  GP    Juel Burrow 11/23/2012, 4:17 PM

## 2012-11-25 ENCOUNTER — Ambulatory Visit (HOSPITAL_COMMUNITY)
Admission: RE | Admit: 2012-11-25 | Discharge: 2012-11-25 | Disposition: A | Discharge status: Home / Self Care | Payer: BC Managed Care – PPO | Source: Ambulatory Visit

## 2012-11-25 DIAGNOSIS — R262 Difficulty in walking, not elsewhere classified: Secondary | ICD-10-CM

## 2012-11-25 DIAGNOSIS — Z96651 Presence of right artificial knee joint: Secondary | ICD-10-CM

## 2012-11-25 DIAGNOSIS — M25561 Pain in right knee: Secondary | ICD-10-CM

## 2012-11-25 DIAGNOSIS — M25661 Stiffness of right knee, not elsewhere classified: Secondary | ICD-10-CM

## 2012-11-25 NOTE — Progress Notes (Signed)
Physical Therapy Treatment Patient Details  Name: NEVAYA NAGELE MRN: [id number] Date of Birth: 21-Oct-1940  Today's Date: 11/25/2012 Time: 1302-1356 PT Time Calculation (min): 54 min Charge: TE 7829-5621, Manual 1335-1345, Estim/Ice 3086-5784  Visit#: 3 of 12  Re-eval: 12/19/12 Assessment Diagnosis: Rt TKR Surgical Date: 10/26/12 Next MD Visit: Dr. Despina Hick -12/03/12 Prior Therapy: HHPT  Authorization: Secondary Medicare  Authorization Time Period:    Authorization Visit#: 3 of 10   Subjective: Symptoms/Limitations Symptoms: Pt stated increased pain following last session, felt better last night, knows she is making improvements with the pain.   Pain Assessment Currently in Pain?: Yes Pain Score: 5  Pain Location: Knee Pain Orientation: Right  Precautions/Restrictions  Precautions Precautions: Knee;Fall  Exercise/Treatments Aerobic Isokinetic: Biodex PROM x 8 minutes 100% extension; flexion 93% Machines for Strengthening  Supine Quad Sets: Right;10 reps;Limitations Quad Sets Limitations: 10" holds with multimodal cueing Short Arc Quad Sets: Right;10 reps;Limitations Short Arc Quad Sets Limitations: 5" holds Heel Slides: Right;10 reps Terminal Knee Extension: Right;10 reps;Limitations Terminal Knee Extension Limitations: 5" holds   Modalities Modalities: Cryotherapy;Electrical Stimulation Manual Therapy Manual Therapy: Myofascial release Myofascial Release: Retro massage with MFR to reduce scar tissue Cryotherapy Number Minutes Cryotherapy: 10 Minutes Cryotherapy Location: Knee Type of Cryotherapy: Ice pack Pharmacologist Location: Rt knee Statistician Action: Hi volt 220 volts with LE elevated with ice to reduce edema and pain Electrical Stimulation Goals: Edema;Pain  Physical Therapy Assessment and Plan PT Assessment and Plan Clinical Impression Statement: Decreased coordination wtih quadriceps, multimodal cueing to  improve activation and contraction of quads.  Pt improved quad contractions with TKE and SAQ.  Manual techniques complete to reduce fascial restrictions and improve patella mobility.  Ended session with hivolt estim with ice and LE elevated for edema and pain control.   PT Plan: Focus on imporving knee A/PROM: heel slides, standing knee flexion, heel/toe raises.  Continue with manual techniques to decrease scar tissue improve patellar mobility and decrease edema    Goals Home Exercise Program Pt/caregiver will Perform Home Exercise Program: For increased ROM;For increased strengthening;Independently PT Short Term Goals Time to Complete Short Term Goals: 3 weeks PT Short Term Goal 1: Pt will improve Rt knee AROM 0-110 degress for greater ease with gait mechanics.  PT Short Term Goal 1 - Progress: Progressing toward goal PT Short Term Goal 2: Pt will present with minimal fascial restrictions and edema to report pain less than 5/10 with walking activities.  PT Short Term Goal 3: Pt will improve her strength by 1 muscle grade to ambulate with improved gait mechanics and begin to ambulate independently.  PT Short Term Goal 3 - Progress: Progressing toward goal PT Long Term Goals Time to Complete Long Term Goals:  (6 weeks) PT Long Term Goal 1: Pt will improve her Rt knee AROM to [facility] in order to ambulate independently with moderate gait impairments.  PT Long Term Goal 2: Pt will improve her LE functional strength in order to go from sit to stand without UE assistance.  Long Term Goal 3: Pt will improve her FOTO to status greater that 49% and limiataion less than 51% for improved QOL in order to return to zumba and swimming activities.   Problem List Patient Active Problem List   Diagnosis Date Noted  . Difficulty in walking(719.7) 11/19/2012  . Pain in right knee 11/19/2012  . Stiffness of right knee 11/19/2012  . Status post total right knee replacement 11/19/2012  . OA (osteoarthritis) of knee  10/26/2012  . Hypersomnia 06/20/2012  . GERD (gastroesophageal reflux disease) 12/02/2011  . GOITER, MULTINODULAR 12/07/2009  . DM 11/09/2009  . VITAMIN B12 DEFICIENCY 11/09/2009  . Cough variant asthma with atopic features 11/09/2009  . 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/06/2009    PT - End of Session Activity Tolerance: Patient tolerated treatment well General Behavior During Therapy: [facility] for tasks assessed/performed  GP    Juel Burrow 11/25/2012, 4:52 PM

## 2012-11-27 ENCOUNTER — Ambulatory Visit (HOSPITAL_COMMUNITY)
Admission: RE | Admit: 2012-11-27 | Discharge: 2012-11-27 | Disposition: A | Discharge status: Home / Self Care | Payer: BC Managed Care – PPO | Source: Ambulatory Visit | Attending: Family Medicine | Admitting: Family Medicine

## 2012-11-27 DIAGNOSIS — Z96651 Presence of right artificial knee joint: Secondary | ICD-10-CM

## 2012-11-27 DIAGNOSIS — R262 Difficulty in walking, not elsewhere classified: Secondary | ICD-10-CM

## 2012-11-27 DIAGNOSIS — M25661 Stiffness of right knee, not elsewhere classified: Secondary | ICD-10-CM

## 2012-11-27 DIAGNOSIS — M25561 Pain in right knee: Secondary | ICD-10-CM

## 2012-11-27 NOTE — Progress Notes (Signed)
Physical Therapy Treatment Patient Details  Name: Grace Cordova MRN: [id number] Date of Birth: [birth year]  Today's Date: 11/27/2012 Time: 1300-1356 PT Time Calculation (min): 56 min Charge: TE 4098-1191, Gait training 1338 1346, Estim/ice 4782-9562  Visit#: 4 of 12  Re-eval: 12/19/12 Assessment Diagnosis: Rt TKR Surgical Date: 10/26/12 Next MD Visit: Dr. Despina Hick -12/03/12 Prior Therapy: HHPT  Authorization: Secondary Medicare  Authorization Time Period:    Authorization Visit#: 4 of 10   Subjective: Symptoms/Limitations Symptoms: Pt stated she was really stiff today.   Pain Assessment Currently in Pain?: Yes Pain Score: 3  Pain Location: Knee Pain Orientation: Right  Precautions/Restrictions  Precautions Precautions: Knee;Fall  Exercise/Treatments Aerobic Stationary Bike: rocking then backwards then forward x 8 minutes seat 13; able to make full 30 revolurtions  Standing Knee Flexion: Right;10 reps;Limitations Knee Flexion Limitations: TKFon 2in step Rocker Board: 2 minutes;Limitations Rocker Board Limitations: R/L and F/B Gait Training: 8' with fiocus on heel to toe  Supine Quad Sets: Right;10 reps;Limitations Quad Sets Limitations: 10 holds Short Arc [employer] Sets: Right;20 reps;Limitations Water quality scientist Limitations: 5" holds 3# Terminal Knee Extension: Right;10 reps;Limitations Terminal Knee Extension Limitations: 5" holds Knee Extension: PROM;1 set Knee Flexion: PROM;1 set   Modalities Modalities: Cryotherapy;Electrical Stimulation Cryotherapy Number Minutes Cryotherapy: 10 Minutes Cryotherapy Location: Knee Type of Cryotherapy: Ice pack Pharmacologist Location: Rt knee Statistician Action: Hi volt 220 volts with LE elevated with ice to reduce edema and pain Electrical Stimulation Goals: Edema;Pain  Physical Therapy Assessment and Plan PT Assessment and Plan Clinical Impression Statement: Began bicycle to  improve AROM, began with rocking then able to make 30 full revoluations over 8 minutes.  Pt continues to require PT facilitation on distal quad contraction to improve activation, pt with improved coordinatino following SAQ and tactile cueing.  Gait training complete to improve mechanics with no AD with multimodal cueing for heel to toe and equalize stride length.  Ended session wtih estim and ice to reduce pain and edema control. PT Plan: Focus on imporving knee A/PROM: heel slides, standing knee flexion, heel/toe raises.  Continue with manual techniques to decrease scar tissue improve patellar mobility and decrease edema    Goals    Problem List Patient Active Problem List   Diagnosis Date Noted  . Difficulty in walking(719.7) 11/19/2012  . Pain in right knee 11/19/2012  . Stiffness of right knee 11/19/2012  . Status post total right knee replacement 11/19/2012  . OA (osteoarthritis) of knee 10/26/2012  . Hypersomnia 06/20/2012  . GERD (gastroesophageal reflux disease) 12/02/2011  . GOITER, MULTINODULAR 12/07/2009  . DM 11/09/2009  . VITAMIN B12 DEFICIENCY 11/09/2009  . Cough variant asthma with atopic features 11/09/2009  . 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/06/2009    PT - End of Session Activity Tolerance: Patient tolerated treatment well General Behavior During Therapy: [facility] for tasks assessed/performed  GP    Juel Burrow 11/27/2012, 3:10 PM

## 2012-11-30 ENCOUNTER — Ambulatory Visit (HOSPITAL_COMMUNITY)
Admission: RE | Admit: 2012-11-30 | Discharge: 2012-11-30 | Disposition: A | Discharge status: Home / Self Care | Payer: BC Managed Care – PPO | Source: Ambulatory Visit | Attending: Family Medicine | Admitting: Family Medicine

## 2012-11-30 DIAGNOSIS — Z96651 Presence of right artificial knee joint: Secondary | ICD-10-CM

## 2012-11-30 DIAGNOSIS — R262 Difficulty in walking, not elsewhere classified: Secondary | ICD-10-CM

## 2012-11-30 DIAGNOSIS — M25661 Stiffness of right knee, not elsewhere classified: Secondary | ICD-10-CM

## 2012-11-30 DIAGNOSIS — M25561 Pain in right knee: Secondary | ICD-10-CM

## 2012-11-30 NOTE — Progress Notes (Addendum)
Physical Therapy Treatment Patient Details  Name: Grace Cordova MRN: [id number] Date of Birth: 1940-07-29  Today's Date: 11/30/2012 Time: 7829-5621 PT Time Calculation (min): 47 min Charge: TE 1348-1400 3086-5784, [place] 1400-1408, Manual 6962-9528  Visit#: 5 of 12  Re-eval: 12/19/12 Assessment Diagnosis: Rt TKR Surgical Date: 10/26/12 Next MD Visit: Dr. Despina Hick -12/03/12 Prior Therapy: HHPT  Authorization: Secondary Medicare  Authorization Time Period:    Authorization Visit#: 5 of 10   Subjective: Symptoms/Limitations Symptoms: Pt stated she was reallly stiff today, MD apt on Thursday.   Pain Assessment Currently in Pain?: Yes Pain Score: 4  Pain Location: Knee Pain Orientation: Right  Precautions/Restrictions  Precautions Precautions: Knee;Fall  AROM: 4-88 PROM 3-90  Exercise/Treatments Stretches Active Hamstring Stretch: 1 rep;30 seconds Passive Hamstring Stretch: 3 reps;30 seconds;Limitations Passive Hamstring Stretch Limitations: with rope Aerobic Stationary Bike: rocking then backwards to forwards x 10 minutes seat 12 56 full revoluations Elliptical:   Machines for Strengthening Cybex Knee Extension: [place] complete on machine with knee extension set on end adjustment 1, positioed at 9  Supine Quad Sets: Right;10 reps;Limitations Quad Sets Limitations: 10" holds with PT facilitation Short Arc [employer] Sets: Right;20 reps;Limitations Short Arc [employer] Limitations: 10" holds with 3# Knee Extension: PROM;1 set Knee Flexion: PROM;1 set   Modalities Modalities: Ultrasound Manual Therapy Manual Therapy: Joint mobilization Joint Mobilization: patella mobs all directions and tib/fib Myofascial Release: scar tissue massage to reduce adhesions Ultrasound Ultrasound Location: anterior Rt knee and joint line on cybex quad machine for knee flexion end adjustment set 1 and 9 Ultrasound Parameters: 1.2w/cm2 continuous x 8 minutes Ultrasound Goals: Other (Comment)  (ROM)  Physical Therapy Assessment and Plan PT Assessment and Plan Clinical Impression Statement: Pt progressing well towards AROM goals, able to make 56 rull revolution on seat 12 on bike today.  Pt with decreased coordination for quadriceps contractions, improved coordination following PT facilitation.  AROM today 4-88.  This sessiom began [place] in seated flexion position f/b manual techniques to reduce fascial restrions and improve ROM.   PT Plan: F/U with MD apt and [place],  Continue with current POC with main focus on imporving knee A/PROM: heel slides, standing knee flexion, heel/toe raises.  Continue with manual techniques to decrease scar tissue improve patellar mobility and decrease edema    Goals Home Exercise Program Pt/caregiver will Perform Home Exercise Program: For increased ROM;For increased strengthening;Independently PT Short Term Goals Time to Complete Short Term Goals: 3 weeks PT Short Term Goal 1: Pt will improve Rt knee AROM 0-110 degress for greater ease with gait mechanics.  PT Short Term Goal 1 - Progress: Progressing toward goal PT Short Term Goal 2: Pt will present with minimal fascial restrictions and edema to report pain less than 5/10 with walking activities.  PT Short Term Goal 2 - Progress: Progressing toward goal PT Short Term Goal 3: Pt will improve her strength by 1 muscle grade to ambulate with improved gait mechanics and begin to ambulate independently.  PT Short Term Goal 3 - Progress: Progressing toward goal PT Long Term Goals Time to Complete Long Term Goals:  (6 weeks) PT Long Term Goal 1: Pt will improve her Rt knee AROM to [facility] in order to ambulate independently with moderate gait impairments.  PT Long Term Goal 1 - Progress: Progressing toward goal PT Long Term Goal 2: Pt will improve her LE functional strength in order to go from sit to stand without UE assistance.  PT Long Term Goal 2 -  Progress: Progressing toward goal Long Term Goal 3: Pt will improve  her FOTO to status greater that 49% and limiataion less than 51% for improved QOL in order to return to zumba and swimming activities.   Problem List Patient Active Problem List   Diagnosis Date Noted  . Difficulty in walking(719.7) 11/19/2012  . Pain in right knee 11/19/2012  . Stiffness of right knee 11/19/2012  . Status post total right knee replacement 11/19/2012  . OA (osteoarthritis) of knee 10/26/2012  . Hypersomnia 06/20/2012  . GERD (gastroesophageal reflux disease) 12/02/2011  . GOITER, MULTINODULAR 12/07/2009  . DM 11/09/2009  . VITAMIN B12 DEFICIENCY 11/09/2009  . Cough variant asthma with atopic features 11/09/2009  . 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/06/2009    PT - End of Session Activity Tolerance: Patient tolerated treatment well General Behavior During Therapy: [facility] for tasks assessed/performed  GP    Juel Burrow 11/30/2012, 2:54 PM

## 2012-12-02 ENCOUNTER — Ambulatory Visit (HOSPITAL_COMMUNITY)
Admission: RE | Admit: 2012-12-02 | Discharge: 2012-12-02 | Disposition: A | Discharge status: Home / Self Care | Payer: BC Managed Care – PPO | Source: Ambulatory Visit | Attending: Family Medicine | Admitting: Family Medicine

## 2012-12-02 NOTE — Progress Notes (Signed)
Physical Therapy Treatment Patient Details  Name: GEORGANN BRAMBLE MRN: [id number] Date of Birth: [birth year]  Today's Date: 12/02/2012 Time: 1352-1440 PT Time Calculation (min): 48 min Visit#: 6 of 12  Re-eval: 12/19/12 Authorization: Secondary Medicare  Authorization Visit#: 6 of 10  Charges:  therex 1352-1416 (24'), [place] 1417-1425 (8'), manual 1426-1440 (14')  Subjective: Symptoms/Limitations Symptoms: pt states last treatment really helped.  States she is still stiff today but loosens up as she gets going.   Pain Assessment Currently in Pain?: Yes Pain Score: 3  Pain Location: Knee Pain Orientation: Right   Exercise/Treatments Aerobic Stationary Bike: rocking then backwards to forwards x 10 minutes seat 12  Machines for Strengthening Cybex Knee Extension: [place] complete on machine with knee extension set on end adjustment 1, positioed at 9  Supine Quad Sets: Right;10 reps;Limitations Quad Sets Limitations: 10" hold Heel Slides: Right;10 reps Knee Extension: PROM;1 set Knee Flexion: PROM;1 set       Modalities Modalities: Ultrasound Manual Therapy Manual Therapy: Other (comment) Myofascial Release: scar tissue massage and MFR to reduce adhesions to distal hamstring, quad  and lateral knee Ultrasound Ultrasound Location: anterior Rt knee and joint line on cybex quad machine for knee flexion end adjustment set at 9 Ultrasound Goals: Other (Comment) (ROM)  Physical Therapy Assessment and Plan PT Assessment and Plan Clinical Impression Statement: Focused majority of session on ROM with increased manual techniques today to help break up adhesions.  Audible pop on scar line at distal quad with manual techniques resulting in increased flexion to approx 95 degrees.   continued with [place] into anterior joint line to help loosen tissue.  Pt reported overall reduction in tightness around knee with pain reduction at end of session.  Pt instructed to f/u with ice later this evening and to  try prior to going to bed to help sleep through the night.   PT Plan: Continue with current POC with main focus on imporving knee A/PROM: standing knee flexion, heel/toe raises.  Continue with manual techniques to decrease scar tissue improve patellar mobility and decrease edema     Problem List Patient Active Problem List   Diagnosis Date Noted  . Difficulty in walking(719.7) 11/19/2012  . Pain in right knee 11/19/2012  . Stiffness of right knee 11/19/2012  . Status post total right knee replacement 11/19/2012  . OA (osteoarthritis) of knee 10/26/2012  . Hypersomnia 06/20/2012  . GERD (gastroesophageal reflux disease) 12/02/2011  . GOITER, MULTINODULAR 12/07/2009  . DM 11/09/2009  . VITAMIN B12 DEFICIENCY 11/09/2009  . Cough variant asthma with atopic features 11/09/2009  . 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/06/2009    PT - End of Session Activity Tolerance: Patient tolerated treatment well General Behavior During Therapy: [facility] for tasks assessed/performed   Lurena Nida, PTA/CLT 12/02/2012, 3:20 PM

## 2012-12-04 ENCOUNTER — Ambulatory Visit (HOSPITAL_COMMUNITY)
Admission: RE | Admit: 2012-12-04 | Discharge: 2012-12-04 | Disposition: A | Discharge status: Home / Self Care | Payer: BC Managed Care – PPO | Source: Ambulatory Visit | Attending: Family Medicine | Admitting: Family Medicine

## 2012-12-04 DIAGNOSIS — M25661 Stiffness of right knee, not elsewhere classified: Secondary | ICD-10-CM

## 2012-12-04 DIAGNOSIS — Z96651 Presence of right artificial knee joint: Secondary | ICD-10-CM

## 2012-12-04 DIAGNOSIS — M25561 Pain in right knee: Secondary | ICD-10-CM

## 2012-12-04 DIAGNOSIS — R262 Difficulty in walking, not elsewhere classified: Secondary | ICD-10-CM

## 2012-12-04 NOTE — Progress Notes (Signed)
Physical Therapy Treatment Patient Details  Name: Grace Cordova MRN: [id number] Date of Birth: [birth year]  Today's Date: 12/04/2012 Time: 2130-8657 PT Time Calculation (min): 62 min Charges: TE: 8469-6295 Manual: 1425-1435 Ice: 1 Visit#: 7 of 12  Re-eval: 12/19/12 Assessment Diagnosis: Rt TKR Surgical Date: 10/26/12 Next MD Visit: Dr. Despina Hick -01/07/13  Authorization: Secondary Medicare  Authorization Time Period:    Authorization Visit#: 7 of 10   Subjective: Symptoms/Limitations Symptoms: Pt reports MD pleased with progress.  States it "doubled" from her previous visit.  She continues to be stiff and sore.  Is working hard on her strength and proper gait mechanics  Pain Assessment Currently in Pain?: Yes Pain Score: 3  Pain Location: Knee  Precautions/Restrictions  Precautions Precautions: Knee;Fall  Exercise/Treatments Aerobic Stationary Bike: rocking backwards and forwards x15 minutes seat 11 able to go around backwards:  Machines for Strengthening Cybex Knee Extension: 1 PL RLE only (w/assistance for extension) 40-90 degrees w/eccentric lowering x10 reps Cybex Knee Flexion: 3 PL RLE only x10 reps Supine Bridges: 10 reps Knee Extension: PROM;1 set Knee Flexion: PROM;1 set  Manual Therapy Manual Therapy: Other (comment) Myofascial Release: scar tissue massage and MFR to reduce adhesions to distal hamstring, quad and lateral knee Cryotherapy Number Minutes Cryotherapy: 10 Minutes Cryotherapy Location: Knee Type of Cryotherapy: Ice pack Ultrasound Ultrasound Goals: Other (Comment) (ROM)  Physical Therapy Assessment and Plan PT Assessment and Plan Clinical Impression Statement: Added eccentric strengthening today to improve quadriceps and hamstring strength.  Discussed with pt on returning to the [facility] to focus on strength on her own and then can focus on pain control and AROM while in therapy.  PT Plan: Continue with current POC with main focus on imporving  knee A/PROM: standing knee flexion, heel/toe raises.  Continue with manual techniques to decrease scar tissue improve patellar mobility and decrease edema    Goals    Problem List Patient Active Problem List   Diagnosis Date Noted  . Difficulty in walking(719.7) 11/19/2012  . Pain in right knee 11/19/2012  . Stiffness of right knee 11/19/2012  . Status post total right knee replacement 11/19/2012  . OA (osteoarthritis) of knee 10/26/2012  . Hypersomnia 06/20/2012  . GERD (gastroesophageal reflux disease) 12/02/2011  . GOITER, MULTINODULAR 12/07/2009  . DM 11/09/2009  . VITAMIN B12 DEFICIENCY 11/09/2009  . Cough variant asthma with atopic features 11/09/2009  . 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/06/2009    PT - End of Session Activity Tolerance: Patient tolerated treatment well General Behavior During Therapy: [facility] for tasks assessed/performed  GP    Grace Cordova 12/04/2012, 2:48 PM

## 2012-12-07 ENCOUNTER — Ambulatory Visit (HOSPITAL_COMMUNITY)
Admission: RE | Admit: 2012-12-07 | Discharge: 2012-12-07 | Disposition: A | Discharge status: Home / Self Care | Payer: BC Managed Care – PPO | Source: Ambulatory Visit | Attending: Family Medicine | Admitting: Family Medicine

## 2012-12-07 NOTE — Progress Notes (Addendum)
Physical Therapy Treatment Patient Details  Name: Grace Cordova MRN: [id number] Date of Birth: 1940-03-18  Today's Date: 12/07/2012 Time: 1350-1435 PT Time Calculation (min): 45 min Charges: Therex x 25'(1350-1415) Manual x 8'(1416-1424) Ice x 10'(1425-1435)  Visit#: 8 of 12  Re-eval: 12/19/12  Authorization: Secondary Medicare  Authorization Visit#: 8 of 10   Subjective: Symptoms/Limitations Symptoms: Pt reports continued HEP compliance. Pain Assessment Currently in Pain?: Yes Pain Score: 3  Pain Location: Knee Pain Orientation: Right  Exercise/Treatments Aerobic Stationary Bike: rocking/full revolutions 8 minutes seat 11 to improve ROM Machines for Strengthening Cybex Knee Extension: 1 PL RLE only (w/assistance for extension) 40-90 degrees w/eccentric lowering x10 reps Cybex Knee Flexion: 3 PL RLE only x10 reps Supine Quad Sets: 10 reps Quad Sets Limitations: with multimodal cueing to facilitate contraction Bridges: 10 reps Knee Extension: PROM Knee Flexion: PROM  Manual Therapy Manual Therapy: Other (comment) Myofascial Release: scar tissue massage and MFR to reduce adhesions to distal quad and throuhgout knee Cryotherapy Number Minutes Cryotherapy: 10 Minutes Cryotherapy Location: Knee Type of Cryotherapy: Ice pack Ultrasound Ultrasound Goals: Other (Comment) (ROM)  Physical Therapy Assessment and Plan PT Assessment and Plan Clinical Impression Statement: Treatment focus on improving strength/ROM. Manual techniques completed to right knee to decrease tightness and adhesions. Ice applied at end of session to limit pain and inflammation. PT Plan: Continue with current POC with main focus on imporving knee A/PROM: standing knee flexion, heel/toe raises.  Continue with manual techniques to decrease scar tissue improve patellar mobility and decrease edema.     Problem List Patient Active Problem List   Diagnosis Date Noted  . Difficulty in walking(719.7)  11/19/2012  . Pain in right knee 11/19/2012  . Stiffness of right knee 11/19/2012  . Status post total right knee replacement 11/19/2012  . OA (osteoarthritis) of knee 10/26/2012  . Hypersomnia 06/20/2012  . GERD (gastroesophageal reflux disease) 12/02/2011  . GOITER, MULTINODULAR 12/07/2009  . DM 11/09/2009  . VITAMIN B12 DEFICIENCY 11/09/2009  . Cough variant asthma with atopic features 11/09/2009  . 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/06/2009    PT - End of Session Activity Tolerance: Patient tolerated treatment well General Behavior During Therapy: [facility] for tasks assessed/performed  Seth Bake, PTA 12/07/2012, 3:44 PM

## 2012-12-09 ENCOUNTER — Ambulatory Visit (HOSPITAL_COMMUNITY): Payer: BC Managed Care – PPO | Admitting: Physical Therapy

## 2012-12-11 ENCOUNTER — Ambulatory Visit (HOSPITAL_COMMUNITY)
Admission: RE | Admit: 2012-12-11 | Discharge: 2012-12-11 | Disposition: A | Discharge status: Home / Self Care | Payer: BC Managed Care – PPO | Source: Ambulatory Visit | Attending: Family Medicine | Admitting: Family Medicine

## 2012-12-11 DIAGNOSIS — M25661 Stiffness of right knee, not elsewhere classified: Secondary | ICD-10-CM

## 2012-12-11 DIAGNOSIS — M25561 Pain in right knee: Secondary | ICD-10-CM

## 2012-12-11 DIAGNOSIS — Z96651 Presence of right artificial knee joint: Secondary | ICD-10-CM

## 2012-12-11 DIAGNOSIS — R262 Difficulty in walking, not elsewhere classified: Secondary | ICD-10-CM

## 2012-12-11 NOTE — Evaluation (Signed)
Physical Therapy Re-Evaluation  Patient Details  Name: Grace Cordova MRN: [id number] Date of Birth: [birth year]  Today's Date: 12/11/2012 Time: 5621-3086 PT Time Calculation (min): 54 min Charges: TE: 5784-6962 Manual: 1324-1340 MMT/ROM: 1 Ice: 1             Visit#: 9 of 17  Re-eval: 01/07/13 Assessment Diagnosis: Rt TKR Surgical Date: 10/26/12 Next MD Visit: Dr. Despina Hick -01/07/13  Authorization: Secondary Medicare    Authorization Time Period:    Authorization Visit#: 9 of 19   Subjective Symptoms/Limitations Symptoms: Pt reports she is still very stiff and sore.  States she is trying to do some exercises at home.  Pain Assessment Currently in Pain?: Yes Pain Score: 3  Pain Location: Knee  Precautions/Restrictions  Precautions Precautions: Knee;Fall  Assessment RLE AROM (degrees) Right Knee Extension: 4 (was 5) Right Knee Flexion: 95 (was 70) RLE PROM (degrees) Right Knee Extension: 0 (was 0) Right Knee Flexion: 105 (was 75) RLE Strength Right Hip Flexion: 5/5 (was 4/5) Right Hip Extension: 4/5 (was 4/5) Right Hip ABduction: 5/5 (was 5/5) Right Hip ADduction: 5/5 (was 4/5) Right Knee Flexion: 3+/5 (was 3+/5) Right Knee Extension: 3+/5 (was 3+/5)  Mobility/Balance     Exercise/Treatments Aerobic Stationary Bike: rocking/full revolutions (40 consecutive times) 10 minutes seat 9 to improve ROM Machines for Strengthening Cybex Knee Extension: 1 PL RLE only (w/assistance for extension)  w/eccentric lowering 2x5 reps w/10 sec holds in flexion on start position 10 for flexion Standing Knee Flexion: Right;Limitations;15 reps Knee Flexion Limitations: TKFon 2in step Lateral Step Up: Right;10 reps;Hand Hold: 1;Step Height: 4" Forward Step Up: Right;15 reps;Hand Hold: 1;Step Height: 4"  Manual Therapy Manual Therapy: Other (comment) Myofascial Release: seated in cybex knee extension machine with knee flexion set at 9 to distal quadrciep and inscional scar Other  Manual Therapy: contract-relax prone 2x 3sets x10 sec holds.  PROM in supine Cryotherapy Number Minutes Cryotherapy: 10 Minutes Cryotherapy Location: Knee Ultrasound Ultrasound Goals: Other (Comment) (ROM)  Physical Therapy Assessment and Plan PT Assessment and Plan Clinical Impression Statement: Ms. Teffeteller has attended 9 OP PT visits over the past 3 weeks with following findings: making slow and steady progress towards goals. has increased pain at end range of PROM of 105 degrees Rt knee flexion with a normal end feel, likely would be able to push to 110 degrees.  At this time most conerned with her functional strength and functional AROM.  Will decreased to 2x/week x4 weeks to focus on range and deep manual therapy to decrease scar tissue formation and improve function.  Pt will benefit from skilled therapeutic intervention in order to improve on the following deficits: Pain;Decreased strength;Decreased balance;Decreased range of motion PT Frequency: Min 2X/week PT Duration: 4 weeks PT Treatment/Interventions: DME instruction;Gait training;Functional mobility training;Therapeutic exercise;Patient/family education;Stair training;Therapeutic activities;Balance training;Neuromuscular re-education;Manual techniques PT Plan: Continue to improve functional strength (stair training, weights, wall squats) and manual techniques to decrease scar tissue to distal quadricep musculatreu with PROM.     Goals Home Exercise Program Pt/caregiver will Perform Home Exercise Program: For increased ROM;For increased strengthening;Independently PT Goal: Perform Home Exercise Program - Progress: Met PT Short Term Goals Time to Complete Short Term Goals: 3 weeks PT Short Term Goal 1: Pt will improve Rt knee AROM 0-110 degress for greater ease with gait mechanics.  PT Short Term Goal 1 - Progress: Progressing toward goal PT Short Term Goal 2: Pt will present with minimal fascial restrictions and edema to report  pain less than 5/10  with walking activities.  PT Short Term Goal 2 - Progress: Progressing toward goal PT Short Term Goal 3: Pt will improve her strength by 1 muscle grade to ambulate with improved gait mechanics and begin to ambulate independently.  PT Short Term Goal 3 - Progress: Progressing toward goal PT Long Term Goals Time to Complete Long Term Goals:  (6 weeks) PT Long Term Goal 1: Pt will improve her Rt knee AROM to [facility] in order to ambulate independently with moderate gait impairments.  PT Long Term Goal 1 - Progress: Progressing toward goal PT Long Term Goal 2: Pt will improve her LE functional strength in order to go from sit to stand without UE assistance.  PT Long Term Goal 2 - Progress: Progressing toward goal Long Term Goal 3: Pt will improve her FOTO to status greater that 49% and limiataion less than 51% for improved QOL in order to return to zumba and swimming activities.  Long Term Goal 3 Progress: Progressing toward goal  Problem List Patient Active Problem List   Diagnosis Date Noted  . Difficulty in walking(719.7) 11/19/2012  . Pain in right knee 11/19/2012  . Stiffness of right knee 11/19/2012  . Status post total right knee replacement 11/19/2012  . OA (osteoarthritis) of knee 10/26/2012  . Hypersomnia 06/20/2012  . GERD (gastroesophageal reflux disease) 12/02/2011  . GOITER, MULTINODULAR 12/07/2009  . DM 11/09/2009  . VITAMIN B12 DEFICIENCY 11/09/2009  . Cough variant asthma with atopic features 11/09/2009  . 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/06/2009    PT - End of Session Activity Tolerance: Patient tolerated treatment well General Behavior During Therapy: WFL for tasks assessed/performed  GP Functional Assessment Tool Used: FOTO: 33/66 (was 28/72) Functional Limitation: Mobility: Walking and moving around Mobility: Walking and Moving Around Current Status (W0981): At least 60 percent but less than 80 percent impaired, limited or restricted Mobility:  Walking and Moving Around Goal Status [phone]): At least 40 percent but less than 60 percent impaired, limited or restricted  Gradie Butrick, MPT, ATC 12/11/2012, 3:06 PM  Physician Documentation Your signature is required to indicate approval of the treatment plan as stated above.  Please sign and either send electronically or make a copy of this report for your files and return this physician signed original.   Please mark one 1.__approve of plan  2. ___approve of plan with the following conditions.   ______________________________                                                          _____________________ Physician Signature                                                                                                             Date

## 2012-12-22 ENCOUNTER — Ambulatory Visit (HOSPITAL_COMMUNITY)
Admission: RE | Admit: 2012-12-22 | Discharge: 2012-12-22 | Disposition: A | Discharge status: Home / Self Care | Payer: BC Managed Care – PPO | Source: Ambulatory Visit | Attending: Family Medicine | Admitting: Family Medicine

## 2012-12-22 DIAGNOSIS — Z96651 Presence of right artificial knee joint: Secondary | ICD-10-CM

## 2012-12-22 DIAGNOSIS — IMO0001 Reserved for inherently not codable concepts without codable children: Secondary | ICD-10-CM | POA: Insufficient documentation

## 2012-12-22 DIAGNOSIS — E119 Type 2 diabetes mellitus without complications: Secondary | ICD-10-CM | POA: Insufficient documentation

## 2012-12-22 DIAGNOSIS — M25661 Stiffness of right knee, not elsewhere classified: Secondary | ICD-10-CM

## 2012-12-22 DIAGNOSIS — R262 Difficulty in walking, not elsewhere classified: Secondary | ICD-10-CM | POA: Insufficient documentation

## 2012-12-22 DIAGNOSIS — M25669 Stiffness of unspecified knee, not elsewhere classified: Secondary | ICD-10-CM | POA: Insufficient documentation

## 2012-12-22 DIAGNOSIS — M25569 Pain in unspecified knee: Secondary | ICD-10-CM | POA: Insufficient documentation

## 2012-12-22 DIAGNOSIS — M25561 Pain in right knee: Secondary | ICD-10-CM

## 2012-12-22 NOTE — Progress Notes (Signed)
Physical Therapy Treatment Patient Details  Name: Grace Cordova MRN: [id number] Date of Birth: 1940-03-26  Today's Date: 12/22/2012 Time: 9147-8295 PT Time Calculation (min): 52 min Charge: [place] 1305-1313, Manual 1313-1325, TE 1325-1355  Visit#: 10 of 17  Re-eval: 01/07/13 Assessment Diagnosis: Rt TKR Surgical Date: 10/26/12 Next MD Visit: Dr. Despina Hick -01/07/13 Prior Therapy: [facility]  Authorization: Secondary Medicare  Authorization Time Period:    Authorization Visit#: 10 of 19   Subjective: Symptoms/Limitations Symptoms: Pt stated she had forgooten pain meds prior surgery.  Pain scale 1-2/10 today.  Pt reported she is able to stand for an hour to prepare a meal, over hoiday still has difficulty with stairs and still uses shower chair. 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 Bike: rocking then full revolutions (60 consecutive times) x 10 minutes seat 9 to improve ROM Isokinetic: Biodex PROM x 10 minutes 100% extension; flexion 88%, [place] complete in flexed 88% x 8 minutes with manual following in flexed Standing Lateral Step Up: Right;Hand Hold: 1;Step Height: 4";15 reps Forward Step Up: Right;15 reps;Hand Hold: 1;Step Height: 4" Step Down: Right;15 reps;Hand Hold: 2;Step Height: 4"   Modalities Modalities: Ultrasound Ultrasound Ultrasound Location: anterior Rt knee on Biodex PROM flexed hold at 88% Ultrasound Parameters: 0.8 w/cm2 continuous x 8 minutes  Ultrasound Goals: Other (Comment) (ROM)  Physical Therapy Assessment and Plan PT Assessment and Plan Clinical Impression Statement: Session focus on improving ROM and functional strengthening.  Completed continuous [place] in Biodex PROM in flexed position at 88%  and manual techniques to reduce fascial restrictions to improve ROM.  Began step down to improve eccentric quad control and wall squats for strengthening.   PT Plan: Continue to improve functional strength (stair training, weights, wall squats) and manual techniques to decrease scar tissue to distal quadricep musculature with PROM.     Goals Home Exercise Program Pt/caregiver will Perform Home Exercise Program: For increased ROM;For increased strengthening;Independently PT Short Term Goals Time to Complete Short Term Goals: 3 weeks PT Short Term Goal 1: Pt will improve Rt knee AROM 0-110 degress for greater ease with gait mechanics.  PT Short Term Goal 1 - Progress: Progressing toward goal PT Short Term Goal 2: Pt will present with minimal fascial restrictions and edema to report pain less than 5/10 with walking activities.  PT Short Term Goal 2 - Progress: Progressing toward goal PT Short Term Goal 3: Pt will improve her strength by 1 muscle grade to ambulate with improved gait mechanics and begin to ambulate independently.  PT Short Term Goal 3 - Progress: Progressing toward goal PT Long Term Goals Time to Complete Long Term Goals:  (6 weeks) PT Long Term Goal 1: Pt will improve her Rt knee AROM to [facility] in order to ambulate independently with moderate gait impairments.  PT Long Term Goal 1 - Progress: Progressing toward goal PT Long Term Goal 2: Pt will improve her LE functional strength in order to go from sit to stand without UE assistance.  PT Long Term Goal 2 - Progress: Progressing toward goal Long Term Goal 3: Pt will improve her FOTO to status greater that 49% and limiataion less than 51% for improved QOL in order to return to zumba and swimming activities.   Problem List Patient Active Problem List   Diagnosis Date Noted  .  Difficulty in walking(719.7) 11/19/2012  . Pain in right knee 11/19/2012  . Stiffness of right knee 11/19/2012  . Status post total right knee replacement  11/19/2012  . OA (osteoarthritis) of knee 10/26/2012  . Hypersomnia 06/20/2012  . GERD (gastroesophageal reflux disease) 12/02/2011  . GOITER, MULTINODULAR 12/07/2009  . DM 11/09/2009  . VITAMIN B12 DEFICIENCY 11/09/2009  . Cough variant asthma with atopic features 11/09/2009  . 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/06/2009    PT - End of Session Activity Tolerance: Patient tolerated treatment well General Behavior During Therapy: [facility] for tasks assessed/performed  GP    Juel Burrow 12/22/2012, 2:43 PM

## 2012-12-24 ENCOUNTER — Ambulatory Visit (HOSPITAL_COMMUNITY)
Admission: RE | Admit: 2012-12-24 | Discharge: 2012-12-24 | Disposition: A | Discharge status: Home / Self Care | Payer: BC Managed Care – PPO | Source: Ambulatory Visit | Attending: Family Medicine | Admitting: Family Medicine

## 2012-12-24 DIAGNOSIS — M25561 Pain in right knee: Secondary | ICD-10-CM

## 2012-12-24 DIAGNOSIS — R262 Difficulty in walking, not elsewhere classified: Secondary | ICD-10-CM

## 2012-12-24 DIAGNOSIS — M25661 Stiffness of right knee, not elsewhere classified: Secondary | ICD-10-CM

## 2012-12-24 DIAGNOSIS — Z96651 Presence of right artificial knee joint: Secondary | ICD-10-CM

## 2012-12-24 NOTE — Progress Notes (Signed)
Physical Therapy Treatment Patient Details  Name: Grace Cordova MRN: [id number] Date of Birth: 02-29-1940  Today's Date: 12/24/2012 Time: 4132-4401 PT Time Calculation (min): 39 min Charges:  TE: 30 [place]: 8 Visit#: 12 of 17  Re-eval: 01/07/13    Authorization: Secondary Medicare  Authorization Time Period:    Authorization Visit#: 12 of 19   Subjective: Symptoms/Limitations Symptoms: Pt reports she didnt sleep well last night.  Pain Assessment Currently in Pain?: Yes Pain Score: 5  Pain Location: Knee  Precautions/Restrictions     Exercise/Treatments Aerobic Stationary Bike: rocking then full revolutions x9 minutes Isokinetic: Biodex PROM x 20 minutes 100% extension; flexion 95%, [place] complete in flexed 85% (40) x 8 minutes with manual following in flexed Standing Knee Flexion: Right;Limitations;15 reps  Modalities Modalities: Ultrasound Ultrasound Ultrasound Location: anterior Rt knee on Biodex PROM flexed hold at 85% Ultrasound Parameters: 0.8 w/cm2 continuous x 8 minutes  Ultrasound Goals: Pain  Physical Therapy Assessment and Plan PT Assessment and Plan Clinical Impression Statement: Continued to focus on techniques to improve knee AROM and decrease pain.  Conitnues to require max cueing for proper gait mechanics after biodex and [place] to improve knee flexion.  PT Plan: Continue to improve functional strength (stair training, weights, wall squats) and manual techniques to decrease scar tissue to distal quadricep musculature with PROM.     Goals    Problem List Patient Active Problem List   Diagnosis Date Noted  . Difficulty in walking(719.7) 11/19/2012  . Pain in right knee 11/19/2012  . Stiffness of right knee 11/19/2012  . Status post total right knee replacement 11/19/2012  . OA (osteoarthritis) of knee 10/26/2012  . Hypersomnia 06/20/2012  . GERD (gastroesophageal reflux disease) 12/02/2011  . GOITER, MULTINODULAR 12/07/2009  . DM 11/09/2009  .  VITAMIN B12 DEFICIENCY 11/09/2009  . Cough variant asthma with atopic features 11/09/2009  . 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/06/2009    PT - End of Session Activity Tolerance: Patient tolerated treatment well General Behavior During Therapy: [facility] for tasks assessed/performed  GP    [name], MPT, ATC 12/24/2012, 2:52 PM

## 2012-12-29 ENCOUNTER — Ambulatory Visit (HOSPITAL_COMMUNITY)
Admission: RE | Admit: 2012-12-29 | Discharge: 2012-12-29 | Disposition: A | Discharge status: Home / Self Care | Payer: BC Managed Care – PPO | Source: Ambulatory Visit

## 2012-12-29 DIAGNOSIS — Z96651 Presence of right artificial knee joint: Secondary | ICD-10-CM

## 2012-12-29 DIAGNOSIS — M25661 Stiffness of right knee, not elsewhere classified: Secondary | ICD-10-CM

## 2012-12-29 DIAGNOSIS — R262 Difficulty in walking, not elsewhere classified: Secondary | ICD-10-CM

## 2012-12-29 DIAGNOSIS — M25561 Pain in right knee: Secondary | ICD-10-CM

## 2012-12-29 NOTE — Progress Notes (Signed)
Physical Therapy Treatment Patient Details  Name: Grace Cordova MRN: [id number] Date of Birth: 1940-12-20  Today's Date: 12/29/2012 Time: 1350-1435 PT Time Calculation (min): 45 min Charges: TE: 1350-1405 Manual: 4098-1191 Ice: 1 Visit#: 13 of 17  Re-eval: 01/07/13    Authorization: Secondary Medicare  Authorization Time Period:    Authorization Visit#: 13 of 19   Subjective: Symptoms/Limitations Symptoms: Pt reports she continues to be stiff.  She feels good after therapy.  Pain Assessment Currently in Pain?: Yes Pain Score: 4  Pain Location: Knee  Precautions/Restrictions     Exercise/Treatments Aerobic  Bike: x8 minutes for AROM Standing  Terminal Knee Extension: 10x5 sec holds Wall squats: 10x5 sec holds Seated Long Arc Quad: Right 3# w/PT facilation for TKE Supine SAQ: 4# x20 reps  Manual Therapy: Scar massage, in knee flexion and extension to improve knee flexion and extension  Cryotherapy x10 minutes Rt knee    Physical Therapy Assessment and Plan PT Assessment and Plan Clinical Impression Statement: Pt continues to improve her functional strength and AROM.  Continues to be most limited by significant pain and tenderness to her knee.  PT Plan: Continue to improve functional strength (stair training, weights, wall squats) and manual techniques to decrease scar tissue to distal quadricep musculature with PROM.     Goals    Problem List Patient Active Problem List   Diagnosis Date Noted  . Difficulty in walking(719.7) 11/19/2012  . Pain in right knee 11/19/2012  . Stiffness of right knee 11/19/2012  . Status post total right knee replacement 11/19/2012  . OA (osteoarthritis) of knee 10/26/2012  . Hypersomnia 06/20/2012  . GERD (gastroesophageal reflux disease) 12/02/2011  . GOITER, MULTINODULAR 12/07/2009  . DM 11/09/2009  . VITAMIN B12 DEFICIENCY 11/09/2009  . Cough variant asthma with atopic features 11/09/2009  . FULL INCONTINENCE OF FECES  06/06/2009   Beronica Lansdale, MPT, ATC 12/29/2012, 2:36 PM

## 2013-01-01 ENCOUNTER — Ambulatory Visit (HOSPITAL_COMMUNITY)
Admission: RE | Admit: 2013-01-01 | Discharge: 2013-01-01 | Disposition: A | Discharge status: Home / Self Care | Payer: BC Managed Care – PPO | Source: Ambulatory Visit

## 2013-01-01 DIAGNOSIS — R262 Difficulty in walking, not elsewhere classified: Secondary | ICD-10-CM

## 2013-01-01 DIAGNOSIS — Z96651 Presence of right artificial knee joint: Secondary | ICD-10-CM

## 2013-01-01 DIAGNOSIS — M25661 Stiffness of right knee, not elsewhere classified: Secondary | ICD-10-CM

## 2013-01-01 DIAGNOSIS — M25561 Pain in right knee: Secondary | ICD-10-CM

## 2013-01-01 NOTE — Progress Notes (Signed)
Physical Therapy Treatment Patient Details  Name: Grace Cordova MRN: [id number] Date of Birth: 1940/02/16  Today's Date: 01/01/2013 Time: 1300-1355 PT Time Calculation (min): 55 min Charges: TE: 1300-1335 Manual: 0865-7846 Ice: 1  Visit#: 14 of 17  Re-eval: 01/07/13 Assessment Diagnosis: Rt TKR Surgical Date: 10/26/12 Next MD Visit: Dr. Despina Hick -01/07/13  Authorization: Secondary Medicare  Authorization Time Period:    Authorization Visit#: 14 of 19   Subjective: Symptoms/Limitations Symptoms: reports she has been working a lot on her walking mechanics.  She is more aware of using her RLE when standing.  Pain Assessment Currently in Pain?: Yes Pain Score: 4   Precautions/Restrictions     Exercise/Treatments Aerobic Stationary Bike: full revolution seat 10 x10 minutes Standing Wall Squat: 15 reps;5 seconds;Limitations Wall Squat Limitations: green ball Supine Bridges: 10 reps;Limitations Bridges Limitations: 5 sec holds green ball Knee Flexion: PROM;AROM Prone  Hamstring Curl: 2 sets;15 reps;Limitations Hamstring Curl Limitations: 5#   Modalities Modalities: Cryotherapy Manual Therapy Joint Mobilization: Grade IV PA to knee joint, patella mobs all directions and tib/fib to improve going mobility  Myofascial Release: Prone to popliteal region to decrease fascial restriction s Cryotherapy Number Minutes Cryotherapy: 10 Minutes Cryotherapy Location: Knee Type of Cryotherapy: Ice pack  Physical Therapy Assessment and Plan PT Assessment and Plan Clinical Impression Statement: Pt able to achieve Rt knee flexion 95 AROM, 110 PROM in supine.  Has notable decrease in hamstring coordinated movements and requires PT faciliation for coorection.  Able to correct and complete independently exercises with increased weight and reps.  significant spasm noted tp popliteal region.   PT Plan: Continue with manual to popliteal region.  continue with focus on hamstring coordinated  movements.     Goals    Problem List Patient Active Problem List   Diagnosis Date Noted  . Difficulty in walking(719.7) 11/19/2012  . Pain in right knee 11/19/2012  . Stiffness of right knee 11/19/2012  . Status post total right knee replacement 11/19/2012  . OA (osteoarthritis) of knee 10/26/2012  . Hypersomnia 06/20/2012  . GERD (gastroesophageal reflux disease) 12/02/2011  . GOITER, MULTINODULAR 12/07/2009  . DM 11/09/2009  . VITAMIN B12 DEFICIENCY 11/09/2009  . Cough variant asthma with atopic features 11/09/2009  . 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/06/2009       GP    Novice Vrba, MPT, ATC 01/01/2013, 3:30 PM

## 2013-01-04 ENCOUNTER — Ambulatory Visit (HOSPITAL_COMMUNITY)
Admission: RE | Admit: 2013-01-04 | Discharge: 2013-01-04 | Disposition: A | Discharge status: Home / Self Care | Payer: BC Managed Care – PPO | Source: Ambulatory Visit

## 2013-01-04 DIAGNOSIS — R262 Difficulty in walking, not elsewhere classified: Secondary | ICD-10-CM

## 2013-01-04 DIAGNOSIS — M25561 Pain in right knee: Secondary | ICD-10-CM

## 2013-01-04 DIAGNOSIS — Z96651 Presence of right artificial knee joint: Secondary | ICD-10-CM

## 2013-01-04 DIAGNOSIS — M25661 Stiffness of right knee, not elsewhere classified: Secondary | ICD-10-CM

## 2013-01-04 NOTE — Progress Notes (Signed)
Physical Therapy Treatment Patient Details  Name: Grace Cordova MRN: [id number] Date of Birth: [birth year]  Today's Date: 01/04/2013 Time: 1300-1355 PT Time Calculation (min): 55 min Charges: TE: 1300-1335 Manual: 8657-8469 Ice: 1 Visit#: 15 of 17  Re-eval: 01/07/13    Authorization: Secondary Medicare  Authorization Time Period:    Authorization Visit#: 15 of 19   Subjective: Symptoms/Limitations Symptoms: Contiues to be limited by stiffness to her knee.  Pain Assessment Currently in Pain?: Yes Pain Score: 4  Pain Location: Knee Pain Orientation: Right Pain Type: Surgical pain;Acute pain  Precautions/Restrictions     Exercise/Treatments Mobility/Balance        Stretches   Aerobic Elliptical: NuStep: 10 minutes level 4 hills #2 Isokinetic: Biodex: Quads/Hamstrings: 180/150<>150/135<>120/105 x10 reps up and back Machines for Strengthening Cybex Knee Extension: 1 PL RLE ONLY 2X15 Cybex Knee Flexion: 3 PL RLE only 2x15 reps setting 7 Supine Knee Flexion: PROM;AROM Other Supine Knee Exercises: Isometric knee flexion: 10x5 sec holds Sidelying   Prone      Modalities Modalities: Cryotherapy Manual Therapy Joint Mobilization: Supine: Grade IV PA to knee joint, patella mobs all directions and tib/fib to improve going mobility  Cryotherapy Number Minutes Cryotherapy: 10 Minutes Cryotherapy Location: Knee Type of Cryotherapy: Ice pack  Physical Therapy Assessment and Plan PT Assessment and Plan Clinical Impression Statement: Pt continues to improve knee AROM.  Worked on functional strengtheniin using isokinetic, isometric strengthening.  Requires max cueing with PT faiciliation for prone hamstring contraction.  PT Plan: Continue with manual to popliteal region.  continue with focus on hamstring coordinated movements.     Goals    Problem List Patient Active Problem List   Diagnosis Date Noted  . Difficulty in walking(719.7) 11/19/2012  . Pain in right  knee 11/19/2012  . Stiffness of right knee 11/19/2012  . Status post total right knee replacement 11/19/2012  . OA (osteoarthritis) of knee 10/26/2012  . Hypersomnia 06/20/2012  . GERD (gastroesophageal reflux disease) 12/02/2011  . GOITER, MULTINODULAR 12/07/2009  . DM 11/09/2009  . VITAMIN B12 DEFICIENCY 11/09/2009  . Cough variant asthma with atopic features 11/09/2009  . 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/06/2009       GP    Grace Cordova 01/04/2013, 2:09 PM

## 2013-01-06 ENCOUNTER — Ambulatory Visit (HOSPITAL_COMMUNITY)
Admission: RE | Admit: 2013-01-06 | Discharge: 2013-01-06 | Disposition: A | Discharge status: Home / Self Care | Payer: BC Managed Care – PPO | Source: Ambulatory Visit

## 2013-01-06 NOTE — Progress Notes (Signed)
Physical Therapy Re-evaluation  Patient Details  Name: Grace Cordova MRN: [id number] Date of Birth: [birth year]  Today's Date: 01/06/2013 Time: 1300-1350 PT Time Calculation (min): 50 min              Visit#: 16 of 24  Re-eval: 02/03/13 Diagnosis: Rt TKR Surgical Date: 10/26/12 Next MD Visit: Dr. Despina Hick -01/07/13 Authorization: Secondary Medicare    Authorization Visit#: 16 of 26  Charges:   therex 4098-1191 (24'), MMT/ROM 4782-9562, Ice 1308-6578 (10')   Subjective Symptoms/Limitations Symptoms: Pt states her pain is 3/10 today and that is after taking a pain pill.  States pain and function are overall better. Pain Assessment Currently in Pain?: Yes Pain Score: 3  Pain Location: Knee Pain Orientation: Right   Objective: RLE AROM (degrees) Right Knee Extension: 0 (was lacking 4 degrees) Right Knee Flexion: 105 (was 95 degrees)  RLE PROM (degrees) Right Knee Extension: 0 (was 0) Right Knee Flexion: 110 (was 105 degrees)  RLE Strength Right Hip Flexion: 5/5 (was 5/5) Right Hip Extension: 5/5 (was 4/5) Right Hip ABduction: 5/5 (was 5/5) Right Hip ADduction: 5/5 (was 5/5) Right Knee Flexion: 4/5 (was 3+/5) Right Knee Extension: 4/5 (was 3+/5)  Exercise/Treatments Machines for Strengthening Cybex Knee Extension: 1 PL RLE ONLY 2X15 Cybex Knee Flexion: 3 PL RLE only 2x15 reps setting 7 Standing Wall Squat: 15 reps;5 seconds;Limitations Wall Squat Limitations: green ball    Modalities Modalities: Cryotherapy Manual Therapy Manual Therapy: Myofascial release Myofascial Release: supine to perimeter of Rt knee, greater focus on medial aspect prior to PROM Cryotherapy Number Minutes Cryotherapy: 10 Minutes Cryotherapy Location: Knee Type of Cryotherapy: Ice pack  Physical Therapy Assessment and Plan PT Assessment and Plan Clinical Impression Statement: Pt has completed 16 PT visits for Rt TKR.  Pt is overall progressing well with improving ROM and strength.   Major limitation has been pain.  Pt continues to be lacking in end range strength of hamstring, requiring AA to achieve knee flexion after 98 degrees.  Pt would benefit from further skilled therapy to obtain all goals and return to PLOF. PT Plan: Await further MD orders    Goals Home Exercise Program Pt/caregiver will Perform Home Exercise Program: For increased ROM;For increased strengthening;Independently PT Goal: Perform Home Exercise Program - Progress: Met  PT Short Term Goals Time to Complete Short Term Goals: 3 weeks PT Short Term Goal 1: Pt will improve Rt knee AROM 0-110 degress for greater ease with gait mechanics. - Progress: Partly met PT Short Term Goal 2: Pt will present with minimal fascial restrictions and edema to report pain less than 5/10 with walking activities.- Progress: Partly met PT Short Term Goal 3: Pt will improve her strength by 1 muscle grade to ambulate with improved gait mechanics and begin to ambulate independently.  - Progress: Partly met  PT Long Term Goals Time to Complete Long Term Goals:  (6 weeks) PT Long Term Goal 1: Pt will improve her Rt knee AROM to [facility] in order to ambulate independently with moderate gait impairments. - Progress: Progressing toward goal PT Long Term Goal 2: Pt will improve her LE functional strength in order to go from sit to stand without UE assistance.- Progress: Met Long Term Goal 3: Pt will improve her FOTO to status greater that 49% and limiataion less than 51% for improved QOL in order to return to zumba and swimming activities.-Progress: Progressing toward goal  Problem List Patient Active Problem List   Diagnosis Date Noted  . Difficulty  in walking(719.7) 11/19/2012  . Pain in right knee 11/19/2012  . Stiffness of right knee 11/19/2012  . Status post total right knee replacement 11/19/2012  . OA (osteoarthritis) of knee 10/26/2012  . Hypersomnia 06/20/2012  . GERD (gastroesophageal reflux disease) 12/02/2011  . GOITER,  MULTINODULAR 12/07/2009  . DM 11/09/2009  . VITAMIN B12 DEFICIENCY 11/09/2009  . Cough variant asthma with atopic features 11/09/2009  . 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/06/2009       GP Functional Assessment Tool Used: FOTO: 43/57 (was 33/66) Functional Limitation: Mobility: Walking and moving around Mobility: Walking and Moving Around Current Status (Z6109): At least 40 percent but less than 60 percent impaired, limited or restricted Mobility: Walking and Moving Around Goal Status [phone]): At least 40 percent but less than 60 percent impaired, limited or restricted  Lurena Nida, PTA/CLT 01/06/2013, 2:09 PM

## 2013-01-19 ENCOUNTER — Ambulatory Visit (HOSPITAL_COMMUNITY)
Admission: RE | Admit: 2013-01-19 | Discharge: 2013-01-19 | Disposition: A | Discharge status: Home / Self Care | Payer: BC Managed Care – PPO | Source: Ambulatory Visit | Attending: Family Medicine | Admitting: Family Medicine

## 2013-01-19 DIAGNOSIS — Z96651 Presence of right artificial knee joint: Secondary | ICD-10-CM

## 2013-01-19 DIAGNOSIS — M25561 Pain in right knee: Secondary | ICD-10-CM

## 2013-01-19 DIAGNOSIS — R262 Difficulty in walking, not elsewhere classified: Secondary | ICD-10-CM

## 2013-01-19 DIAGNOSIS — M25661 Stiffness of right knee, not elsewhere classified: Secondary | ICD-10-CM

## 2013-01-19 NOTE — Progress Notes (Signed)
Physical Therapy Treatment Patient Details  Name: Grace Cordova MRN: [id number] Date of Birth: [birth year]  Today's Date: 01/19/2013 Time: 4098-1191 PT Time Calculation (min): 45 min Charge: TE 4782-9562, Manual 1308-6578  Visit#: 17 of 24  Re-eval: 02/03/13 Assessment Diagnosis: Rt TKR Surgical Date: 10/26/12 Next MD Visit: Dr. Despina Hick -01/07/13 Prior Therapy: [facility]  Authorization: Secondary Medicare  Authorization Time Period:    Authorization Visit#: 17 of 26   Subjective: Symptoms/Limitations Symptoms: Pt stated MD happy with progress and approved 2 more weeks for therapy on 12/18, begins work on 01/25/2013.  Pt plans to continue OPPT this week but feels unable to attend therapy once return to work. Pain Assessment Currently in Pain?: Yes Pain Score: 3  Pain Location: Knee Pain Orientation: Right  Precautions/Restrictions  Precautions Precautions: Knee;Fall  Exercise/Treatments Aerobic Elliptical: NuStep: 10 minutes level 4 hills #2 Isokinetic: Biodex: PROM 0-100%Quads/Hamstrings: 180/150<>150/135<>120/105 x10 reps up and back Machines for Strengthening Cybex Knee Extension: 1 PL RLE ONLY 2X15 Cybex Knee Flexion: 3.5 PL RLE only 2x15 reps setting 7 Supine Knee Flexion: PROM;AROM  Manual Therapy Manual Therapy: Myofascial release Myofascial Release: supine to perimeter of Rt knee, greater focus on medial aspect prior to PROM with "the stick"  Physical Therapy Assessment and Plan PT Assessment and Plan Clinical Impression Statement: Noted significant reduction in fascial restrictions around knee following manual MFR.  Pt improved AROM to 107 degrees, PROM 112 degrees flexion.  Discussed benefits of compression hose and places to buy.   PT Plan: Continue with current POC, possible next session pt wanting D/C.  Give pt information about [place] compression hose store.    Goals Home Exercise Program Pt/caregiver will Perform Home Exercise Program: For increased  ROM;For increased strengthening;Independently PT Short Term Goals Time to Complete Short Term Goals: 3 weeks PT Short Term Goal 1: Pt will improve Rt knee AROM 0-110 degress for greater ease with gait mechanics.  PT Short Term Goal 1 - Progress: Progressing toward goal PT Short Term Goal 2: Pt will present with minimal fascial restrictions and edema to report pain less than 5/10 with walking activities.  PT Short Term Goal 2 - Progress: Progressing toward goal PT Short Term Goal 3: Pt will improve her strength by 1 muscle grade to ambulate with improved gait mechanics and begin to ambulate independently.  PT Short Term Goal 3 - Progress: Progressing toward goal PT Long Term Goals Time to Complete Long Term Goals:  (6 weeks) PT Long Term Goal 1: Pt will improve her Rt knee AROM to [name] in order to ambulate independently with moderate gait impairments.  PT Long Term Goal 2: Pt will improve her LE functional strength in order to go from sit to stand without UE assistance.  Long Term Goal 3: Pt will improve her FOTO to status greater that 49% and limiataion less than 51% for improved QOL in order to return to zumba and swimming activities.   Problem List Patient Active Problem List   Diagnosis Date Noted  . Difficulty in walking(719.7) 11/19/2012  . Pain in right knee 11/19/2012  . Stiffness of right knee 11/19/2012  . Status post total right knee replacement 11/19/2012  . OA (osteoarthritis) of knee 10/26/2012  . Hypersomnia 06/20/2012  . GERD (gastroesophageal reflux disease) 12/02/2011  . GOITER, MULTINODULAR 12/07/2009  . DM 11/09/2009  . VITAMIN B12 DEFICIENCY 11/09/2009  . Cough variant asthma with atopic features 11/09/2009  . 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/06/2009    PT - End of Session  Activity Tolerance: Patient tolerated treatment well General Behavior During Therapy: [facility] for tasks assessed/performed  GP    Juel Burrow 01/19/2013, 4:57 PM

## 2013-01-22 ENCOUNTER — Ambulatory Visit (HOSPITAL_COMMUNITY): Payer: BC Managed Care – PPO | Admitting: Physical Therapy

## 2013-01-25 ENCOUNTER — Ambulatory Visit (HOSPITAL_COMMUNITY): Payer: BC Managed Care – PPO

## 2013-01-26 ENCOUNTER — Telehealth (HOSPITAL_COMMUNITY): Payer: Self-pay | Admitting: Physical Therapy

## 2013-01-26 NOTE — Progress Notes (Signed)
  Patient Details  Name: Grace Cordova MRN: [id number] Date of Birth: Sep 06, 1940  Today's Date: 01/26/2013  Faxed required information to [employer].   Sanjuana Mruk 01/26/2013, 4:39 PM

## 2013-01-27 ENCOUNTER — Ambulatory Visit (HOSPITAL_COMMUNITY): Payer: BC Managed Care – PPO | Admitting: Physical Therapy

## 2013-12-25 IMAGING — US US SOFT TISSUE HEAD/NECK
1 series · 14 of 25 positions shown · non-contrast
Comparison: 11/15/2009

CLINICAL DATA: Follow-up of multinodular thyroid goiter. History of
prior biopsy of bilateral dominant thyroid nodules.

THYROID ULTRASOUND
TECHNIQUE: Ultrasound examination of the thyroid gland and adjacent
soft tissues was performed.

[Series 1: us soft tissue head/neck · 0.11mm/px · 14 of 80 slices shown]
[im 1/80]
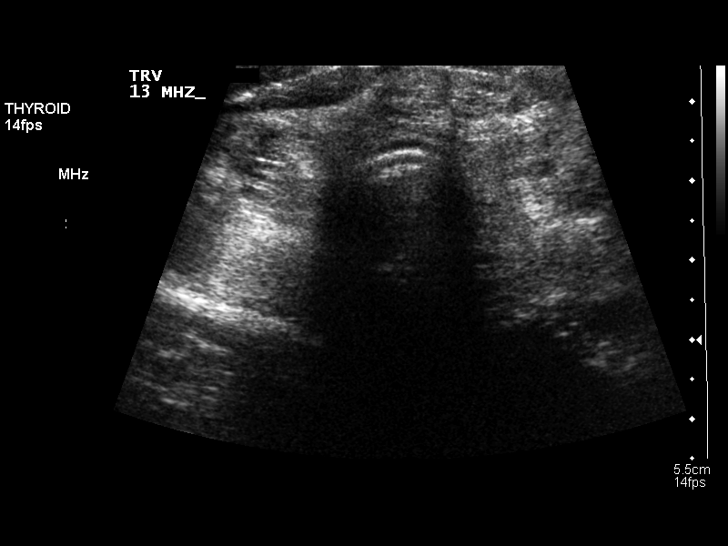
[im 7/80]
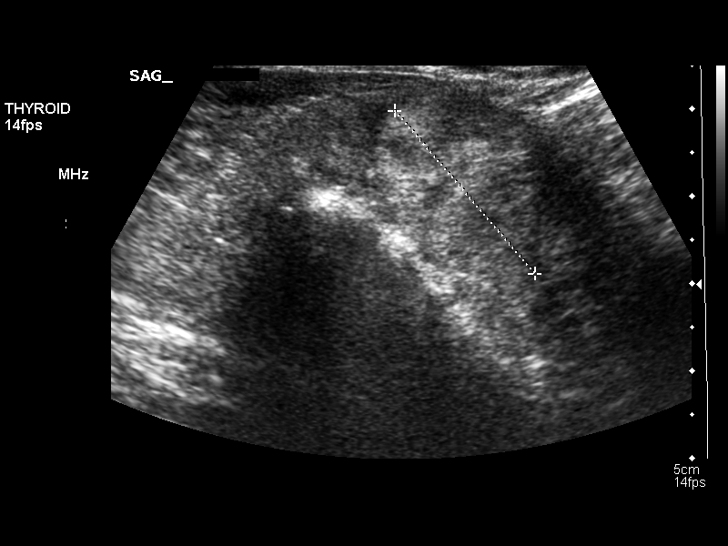
[im 14/80]
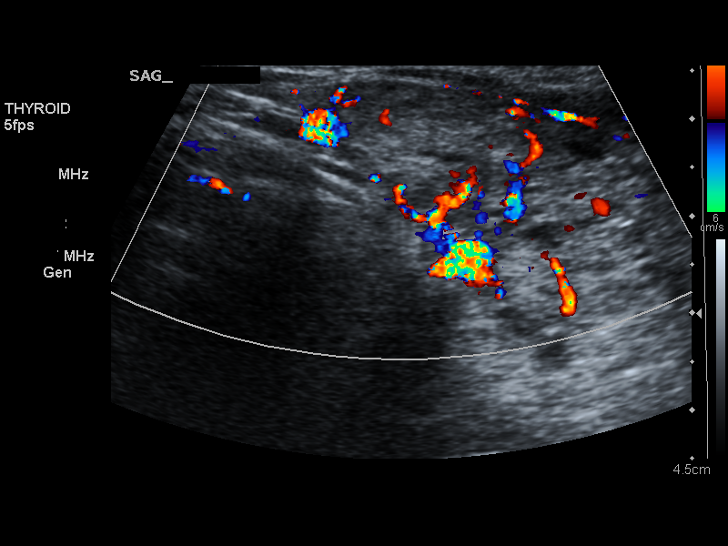
[im 20/80]
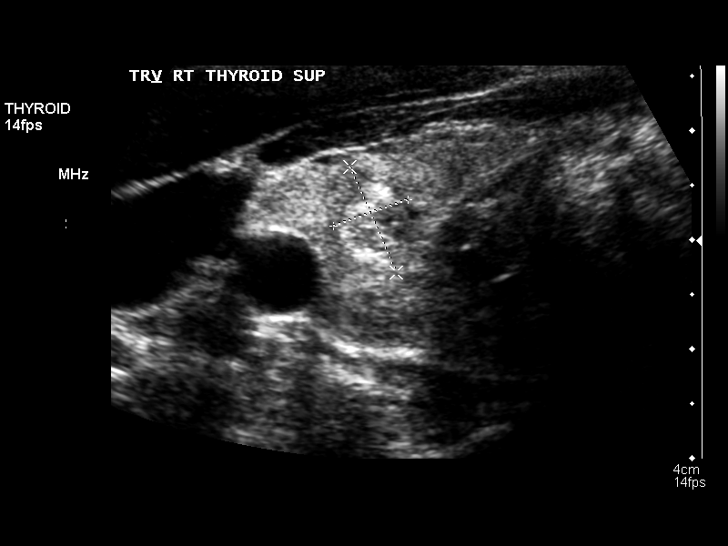
[im 27/80]
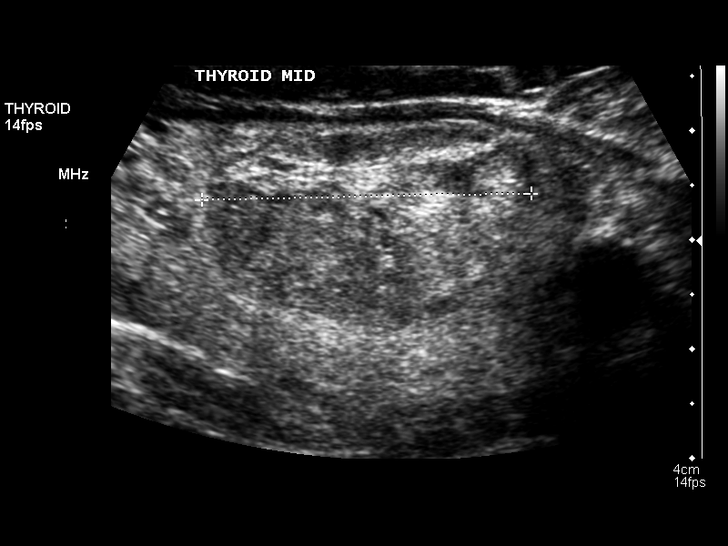
[im 30/80]
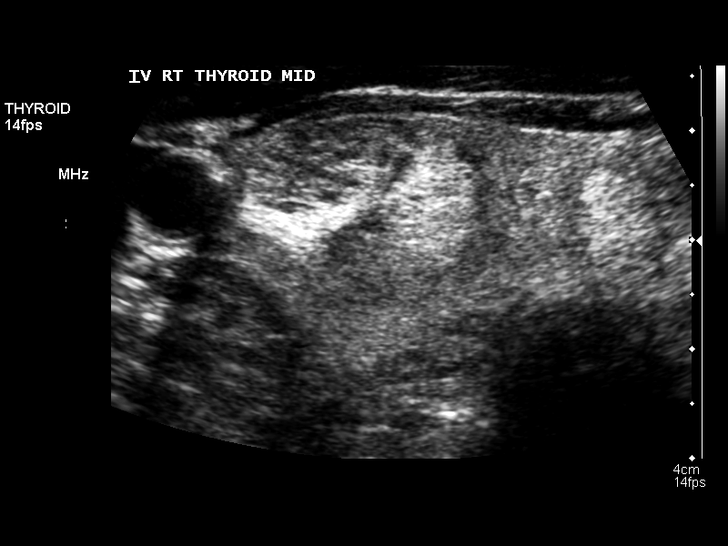
[im 37/80]
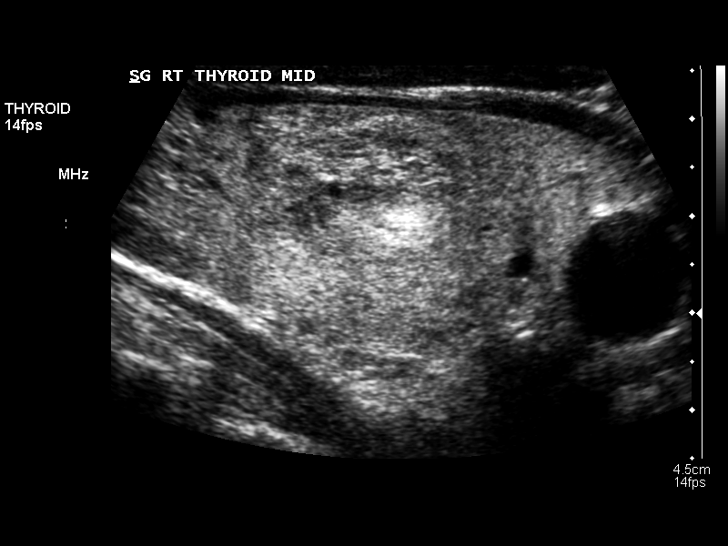
[im 43/80]
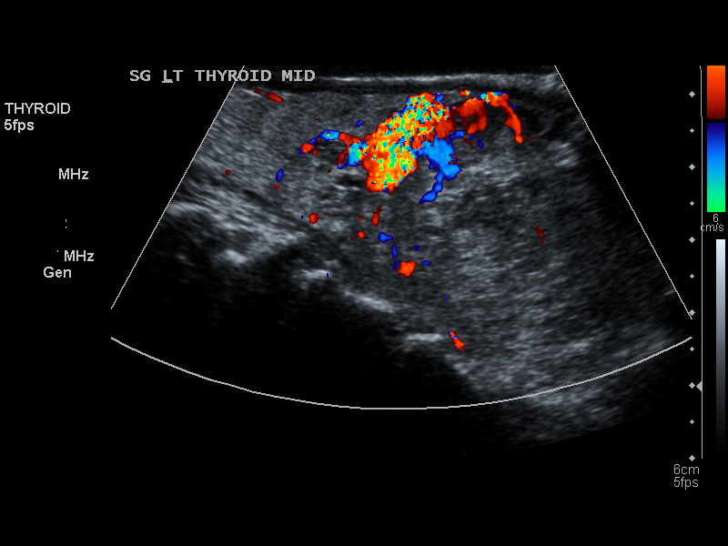
[im 50/80]
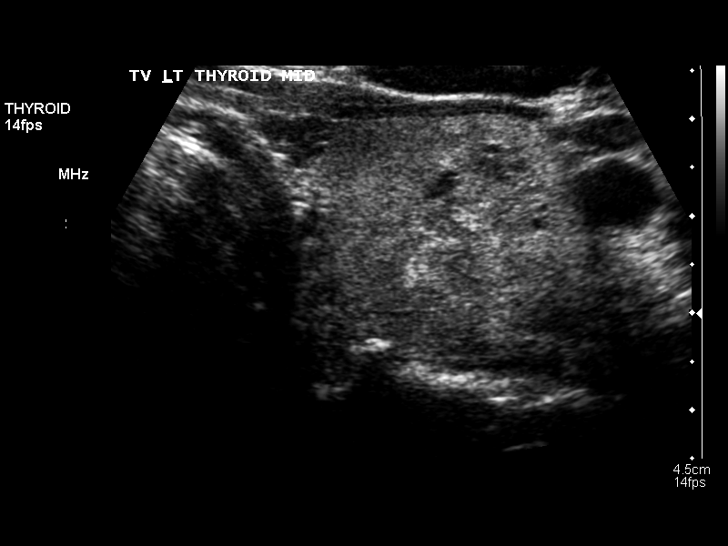
[im 53/80]
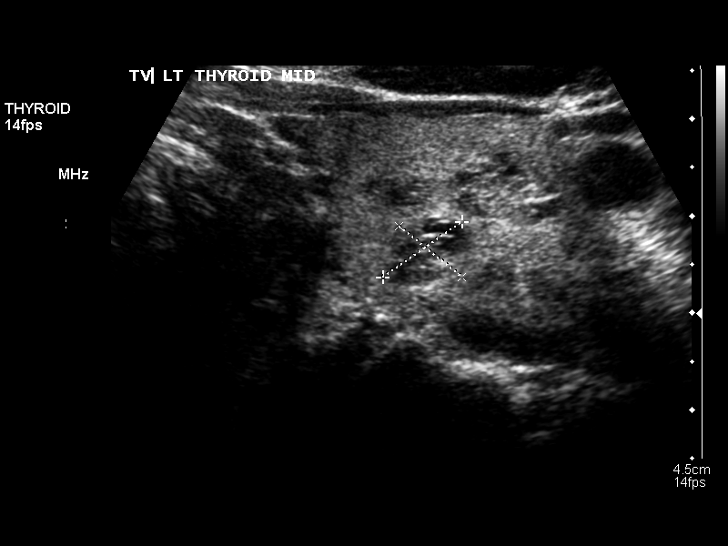
[im 60/80]
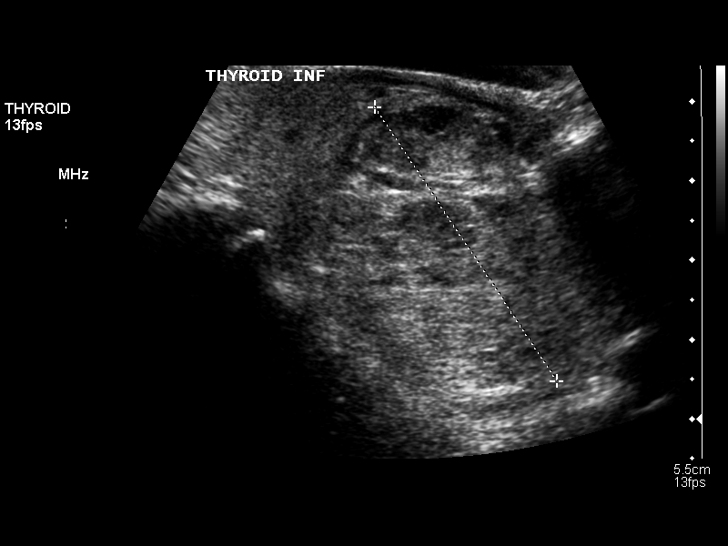
[im 66/80]
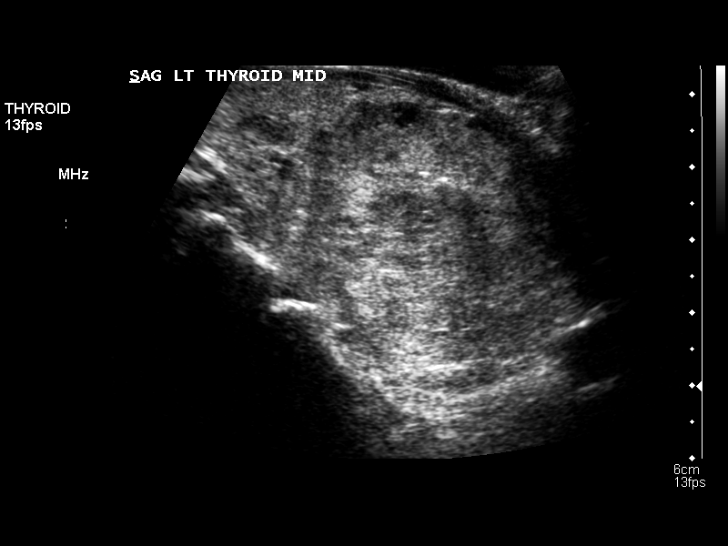
[im 73/80]
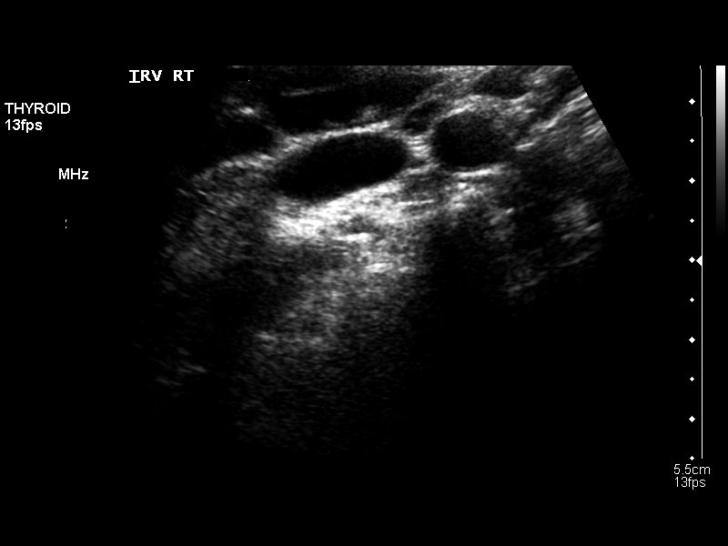
[im 80/80]
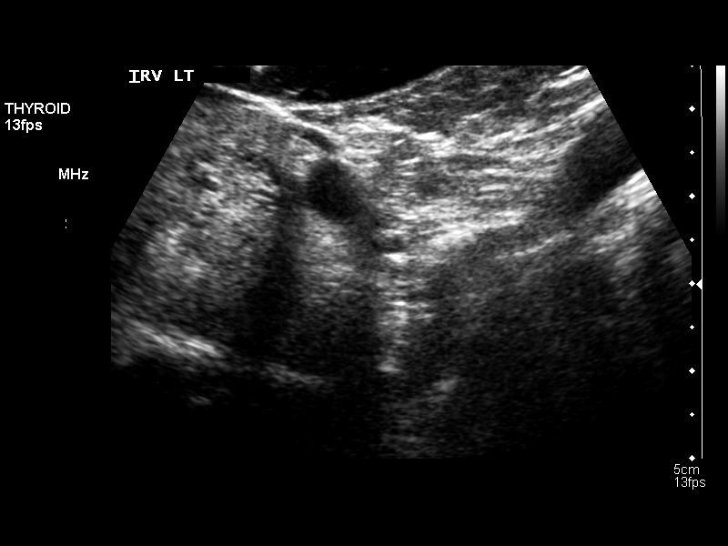

[14 of 25 positions shown; findings below may reference images not displayed]

FINDINGS: Right thyroid lobe:  5.4 x 2.9 x 3.1 cm
Left thyroid lobe:  7.2 x 3.7 x 3.7 cm
Isthmus:  1.3 cm

Focal nodules:  Multiple nodules again noted bilaterally.  The
dominant nodule in the right lobe measures approximately 2.4 x
x 3.0 cm.  Maximum dimension has enlarged slightly since the prior
study from 2.5 cm.

Dominant nodule in the inferior left lobe measures approximately
3.1 x 4.0 x 4.1 cm.  This nodule is stable to slightly decreased in
size.  Multiple other thyroid nodules are identified which are
grossly stable and without evidence of significant enlargement or
change in morphology.

Lymphadenopathy:  None visualized.
IMPRESSION: The only change since prior thyroid imaging is slight enlargement
of the dominant right thyroid nodule.  The dominant left thyroid
nodule measures slightly smaller in maximum dimensions.  Multiple
other bilateral nodules are stable.

## 2014-09-04 IMAGING — CR DG CHEST 2V
2 series · 2 of 2 positions shown · non-contrast
Comparison: 06/13/2011

CLINICAL DATA: Pre operative respiratory exam. Osteoarthritis of
the knee.

EXAM:
CHEST  2 VIEW

[w chest pa]
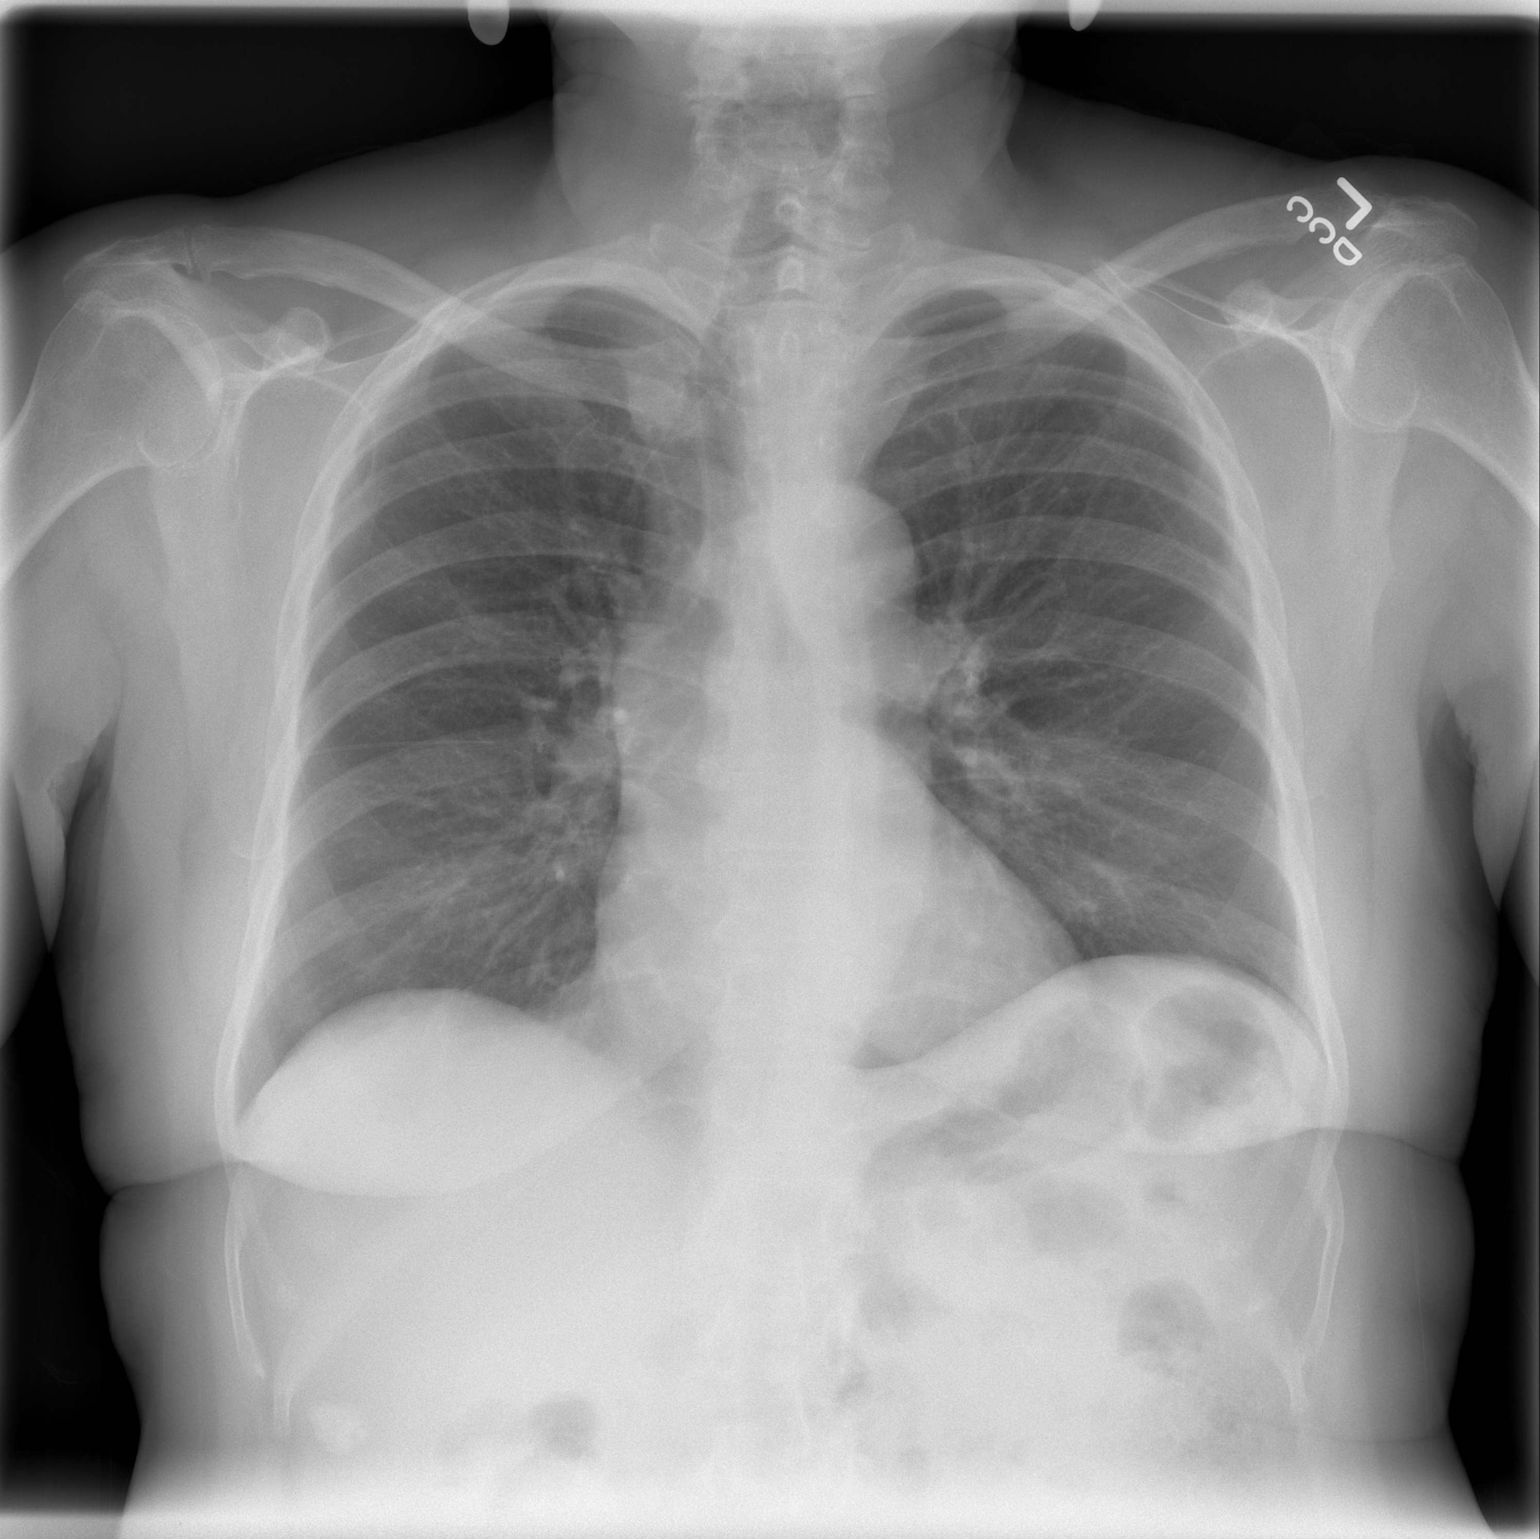

[w chest lat]
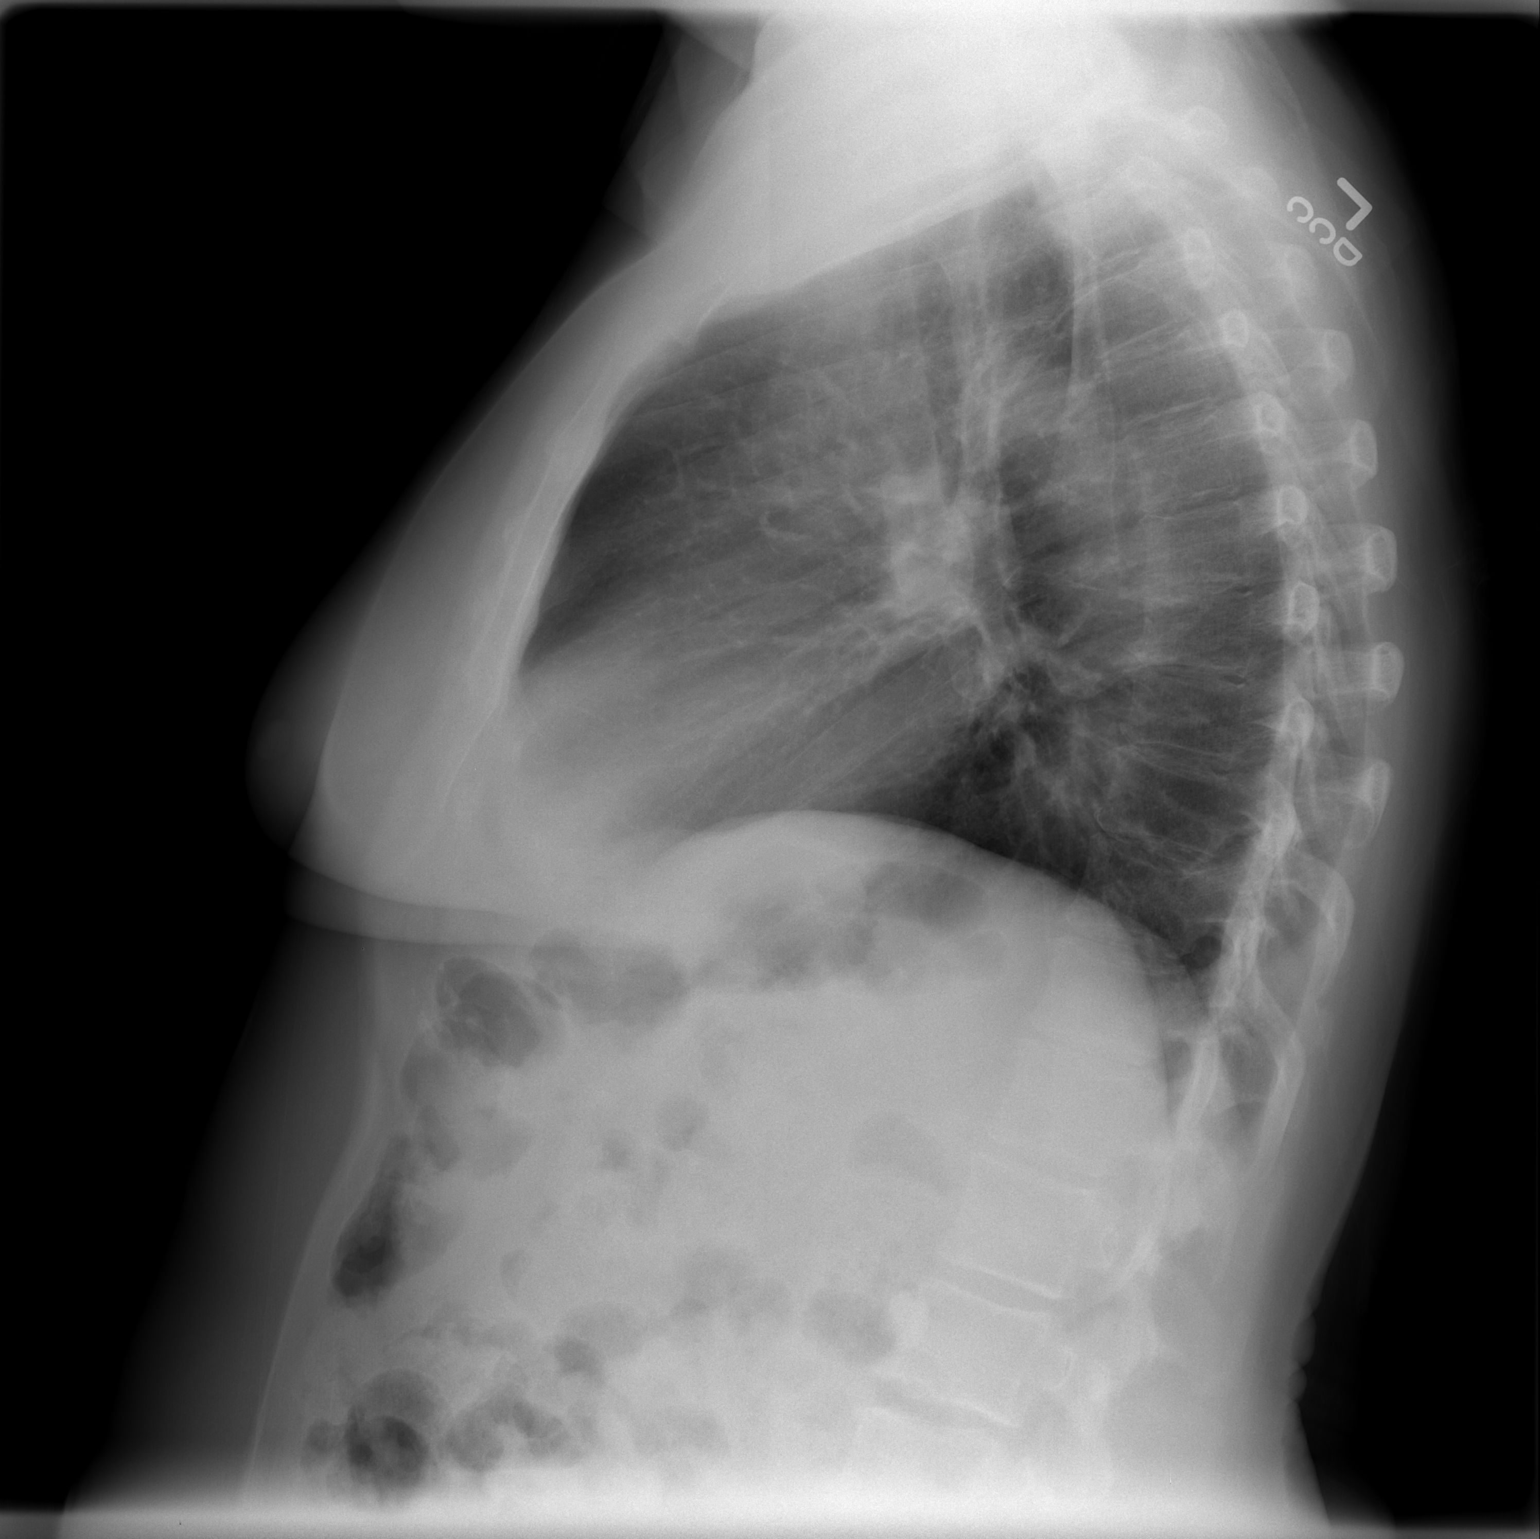

[2 of 2 positions shown; findings below may reference images not displayed]

FINDINGS: The trachea is slightly deviated to the right at the level of the
thoracic inlet. This is unchanged. The patient does have a history
of multinodular goiter.

Heart size and pulmonary vascularity are normal and the lungs are
clear. No acute osseous abnormality.
IMPRESSION: No active cardiopulmonary disease. Tracheal deviation to the right,
probably due to the patient's known multinodular goiter.
